# Patient Record
Sex: Female | Born: 1995 | Race: Black or African American | Hispanic: No | Marital: Single | Smoking: Never smoker
Health system: Southern US, Community
[De-identification: ages and names within clinical notes are randomized; demographics above are authoritative.]

## PROBLEM LIST (undated history)

## (undated) DIAGNOSIS — K5190 Ulcerative colitis, unspecified, without complications: Secondary | ICD-10-CM

## (undated) HISTORY — PX: TONSILLECTOMY: SUR1361

## (undated) HISTORY — PX: WISDOM TOOTH EXTRACTION: SHX21

---
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
## 2015-01-17 ENCOUNTER — Emergency Department (INDEPENDENT_AMBULATORY_CARE_PROVIDER_SITE_OTHER)
Admission: EM | Admit: 2015-01-17 | Discharge: 2015-01-17 | Disposition: A | Discharge status: Home / Self Care | Payer: Medicaid Other | Source: Home / Self Care

## 2015-01-17 ENCOUNTER — Encounter (HOSPITAL_COMMUNITY): Payer: Self-pay | Admitting: Emergency Medicine

## 2015-01-17 DIAGNOSIS — N644 Mastodynia: Secondary | ICD-10-CM

## 2015-01-17 DIAGNOSIS — N611 Abscess of the breast and nipple: Secondary | ICD-10-CM

## 2015-01-17 DIAGNOSIS — N61 Inflammatory disorders of breast: Secondary | ICD-10-CM

## 2015-01-17 MED ORDER — DOXYCYCLINE MONOHYDRATE 100 MG PO CAPS: 100.0000 mg | ORAL_CAPSULE | Freq: Two times a day (BID) | ORAL | Status: DC

## 2015-01-17 NOTE — ED Provider Notes (Addendum)
CSN: [id number]     Arrival date & time 01/17/15  1631 History   None    No chief complaint on file.  (Consider location/radiation/quality/duration/timing/severity/associated sxs/prior Treatment) The history is provided by the patient (And mother). No language interpreter was used.  C/O pain on her right breast and swelling which started a little over 1 wk ago but the pain worsened in the last 2 days., denies any injury, no change in her bra/ under wear. No fever, no nipple discharge, no discharge from the swelling. Some scalyness and dryness on the skin over her breast. Pain is about 6/10 in severity. No known trigger of her symptoms.  Past Medical History  Diagnosis Date  . UC (ulcerative colitis)     Pt reoprts she is in remission   Past Surgical History  Procedure Laterality Date  . Tonsillectomy    . Wisdom tooth extraction     Family History  Problem Relation Age of Onset  . Diabetes Mother   . Diabetes Maternal Grandmother    Social History  Substance Use Topics  . Smoking status: Never Smoker   . Smokeless tobacco: Not on file  . Alcohol Use: No   OB History    Gravida Para Term Preterm AB TAB SAB Ectopic Multiple Living   0              Review of Systems  Respiratory: Negative.   Cardiovascular: Negative.   Gastrointestinal: Negative.   Skin: Positive for wound.       Swelling and pain of right breast  All other systems reviewed and are negative.   Allergies  Review of patient's allergies indicates not on file.  Home Medications   Prior to Admission medications   Not on File   Meds Ordered and Administered this Visit  Medications - No data to display  BP 115/81 mmHg  Pulse 81  Temp(Src) 98.2 F (36.8 C) (Oral)  Resp 16  SpO2 100% No data found.   Physical Exam  Constitutional: She appears well-developed. No distress.  Cardiovascular: Normal rate, regular rhythm, normal heart sounds and intact distal pulses.   No murmur  heard. Pulmonary/Chest: Effort normal and breath sounds normal. No respiratory distress. She has no wheezes. She exhibits no mass and no retraction. Right breast exhibits skin change and tenderness. Right breast exhibits no inverted nipple and no nipple discharge. Left breast exhibits no inverted nipple, no mass, no nipple discharge, no skin change and no tenderness. Breasts are symmetrical.    Musculoskeletal: Normal range of motion.  Nursing note and vitals reviewed.   ED Course  Procedures (including critical care time)  Labs Review Labs Reviewed - No data to display  Imaging Review No results found.   Visual Acuity Review  Right Eye Distance:   Left Eye Distance:   Bilateral Distance:    Right Eye Near:   Left Eye Near:    Bilateral Near:         MDM  No diagnosis found. Breast pain. Carbuncle  Yellowish pus sent for culture. Doxycycline prescribed today. Ibuprofen as needed for pain,. I will call patient with culture report. Patient advised to obtain breast U/S if no improvement despite A/B treatment. She and her mother agreed with plan.        Doreene Eland, MD 01/17/15 [phone]

## 2015-01-17 NOTE — ED Notes (Signed)
At bedside for culture of right breast wound.

## 2015-01-17 NOTE — ED Notes (Signed)
The patient presented to the [facility] with a complaint on a cyst on her right breast. The patient stated that has been there over a week but has grown in size over the last week.

## 2015-01-17 NOTE — Discharge Instructions (Signed)
Abscess °An abscess (boil or furuncle) is an infected area on or under the skin. This area is filled with yellowish-white fluid (pus) and other material (debris). °HOME CARE  °· Only take medicines as told by your doctor. °· If you were given antibiotic medicine, take it as directed. Finish the medicine even if you start to feel better. °· If gauze is used, follow your doctor's directions for changing the gauze. °· To avoid spreading the infection: °¨ Keep your abscess covered with a bandage. °¨ Wash your hands well. °¨ Do not share personal care items, towels, or whirlpools with others. °¨ Avoid skin contact with others. °· Keep your skin and clothes clean around the abscess. °· Keep all doctor visits as told. °GET HELP RIGHT AWAY IF:  °· You have more pain, puffiness (swelling), or redness in the wound site. °· You have more fluid or blood coming from the wound site. °· You have muscle aches, chills, or you feel sick. °· You have a fever. °MAKE SURE YOU:  °· Understand these instructions. °· Will watch your condition. °· Will get help right away if you are not doing well or get worse. °Document Released: 09/27/2007 Document Revised: 10/10/2011 Document Reviewed: 06/23/2011 °ExitCare® Patient Information ©2015 [employer]. This information is not intended to replace advice given to you by your health care provider. Make sure you discuss any questions you have with your health care provider. ° °

## 2015-01-20 LAB — WOUND CULTURE

## 2015-01-21 NOTE — ED Notes (Signed)
Final report of abscess c &S negative

## 2015-06-01 ENCOUNTER — Emergency Department (INDEPENDENT_AMBULATORY_CARE_PROVIDER_SITE_OTHER)
Admission: EM | Admit: 2015-06-01 | Discharge: 2015-06-01 | Disposition: A | Discharge status: Home / Self Care | Payer: Medicaid Other | Source: Home / Self Care | Attending: Family Medicine | Admitting: Family Medicine

## 2015-06-01 ENCOUNTER — Encounter (HOSPITAL_COMMUNITY): Payer: Self-pay | Admitting: Emergency Medicine

## 2015-06-01 DIAGNOSIS — J069 Acute upper respiratory infection, unspecified: Secondary | ICD-10-CM

## 2015-06-01 NOTE — ED Notes (Signed)
Cough, chest soreness, body aches and increase in chest soreness with coughing.  Patient reports nausea, no vomiting.  Patient reports yellow phlegm.  Patient say she ran a fever of 101 last night.  Patient has no fever here today.  Took dayquil early this morning, but nothing recently

## 2015-06-01 NOTE — ED Provider Notes (Signed)
CSN: [id number]     Arrival date & time 06/01/15  1558 History   First MD Initiated Contact with Patient 06/01/15 1756     Chief Complaint  Patient presents with  . URI   (Consider location/radiation/quality/duration/timing/severity/associated sxs/prior Treatment) HPI Comments: 20 year old female complaining of a two-day history of nausea, body aches, cough and chest pain associated with cough. She states last night she had a temperature of 100.9. Denies GI or GU symptoms.  Patient is a 20 y.o. female presenting with URI.  URI Presenting symptoms: congestion, cough and fever   Presenting symptoms: no fatigue, no rhinorrhea and no sore throat   Associated symptoms: no neck pain     Past Medical History  Diagnosis Date  . UC (ulcerative colitis) (HCC)     Pt reoprts she is in remission   Past Surgical History  Procedure Laterality Date  . Tonsillectomy    . Wisdom tooth extraction     Family History  Problem Relation Age of Onset  . Diabetes Mother   . Diabetes Maternal Grandmother    Social History  Substance Use Topics  . Smoking status: Never Smoker   . Smokeless tobacco: None  . Alcohol Use: No   OB History    Gravida Para Term Preterm AB TAB SAB Ectopic Multiple Living   0              Review of Systems  Constitutional: Positive for fever and activity change. Negative for chills, appetite change and fatigue.  HENT: Positive for congestion and postnasal drip. Negative for facial swelling, rhinorrhea and sore throat.   Eyes: Negative.   Respiratory: Positive for cough. Negative for shortness of breath.   Cardiovascular: Negative.   Musculoskeletal: Negative for neck pain and neck stiffness.  Skin: Negative for pallor and rash.  Neurological: Negative.     Allergies  Review of patient's allergies indicates no known allergies.  Home Medications   Prior to Admission medications   Medication Sig Start Date End Date Taking? Authorizing Provider   Pseudoeph-Doxylamine-DM-APAP (NYQUIL PO) Take by mouth.   Yes Historical Provider, MD  Pseudoephedrine-APAP-DM (DAYQUIL PO) Take by mouth.   Yes Historical Provider, MD  doxycycline (MONODOX) 100 MG capsule Take 1 capsule (100 mg total) by mouth 2 (two) times daily. Patient not taking: Reported on 06/01/2015 01/17/15   Doreene Eland, MD  mercaptopurine (PURINETHOL) 50 MG tablet Take 1.5 mg/kg by mouth daily. Give on an empty stomach 1 hour before or 2 hours after meals. Caution: Chemotherapy.    Historical Provider, MD  pantoprazole (PROTONIX) 20 MG tablet Take 20 mg by mouth daily.    Historical Provider, MD   Meds Ordered and Administered this Visit  Medications - No data to display  BP 122/87 mmHg  Pulse 86  Temp(Src) 98.2 F (36.8 C) (Oral)  Resp 12  SpO2 100% No data found.   Physical Exam  Constitutional: She is oriented to person, place, and time. She appears well-developed and well-nourished. No distress.  Sitting on the table. Appears generally well. Smiling and showing no signs of distress. Energetic speech. Relaxed posturing.  HENT:  Mouth/Throat: No oropharyngeal exudate.  Bilateral TMs are normal. Oropharynx with clear PND. No erythema or exudates.  Eyes: Conjunctivae and EOM are normal.  Neck: Normal range of motion. Neck supple.  Cardiovascular: Normal rate, regular rhythm and normal heart sounds.   Pulmonary/Chest: Effort normal and breath sounds normal. No respiratory distress. She has no wheezes.  Musculoskeletal: Normal range  of motion. She exhibits no edema.  Lymphadenopathy:    She has no cervical adenopathy.  Neurological: She is alert and oriented to person, place, and time.  Skin: Skin is warm and dry. No rash noted.  Psychiatric: She has a normal mood and affect.  Nursing note and vitals reviewed.   ED Course  Procedures (including critical care time)  Labs Review Labs Reviewed - No data to display  Imaging Review No results found.   Visual  Acuity Review  Right Eye Distance:   Left Eye Distance:   Bilateral Distance:    Right Eye Near:   Left Eye Near:    Bilateral Near:         MDM   1. URI (upper respiratory infection)    For nasal and head congestion may take Sudafed PE 10 mg every 4 hours as needed. Saline nasal spray used frequently. For drainage may use Allegra, Claritin or Zyrtec. If you need stronger medicine to stop drainage may take Chlor-Trimeton 2-4 mg every 4 hours. This may cause drowsiness. Ibuprofen 600 mg every 6 hours as needed for pain, discomfort or fever. Drink plenty of fluids and stay well-hydrated.     Hayden Rasmussen, NP 06/01/15 [phone]

## 2015-06-01 NOTE — Discharge Instructions (Signed)
Upper Respiratory Infection, Adult °For nasal and head congestion may take Sudafed PE 10 mg every 4 hours as needed. °Saline nasal spray used frequently. °For drainage may use Allegra, Claritin or Zyrtec. If you need stronger medicine to stop drainage may take Chlor-Trimeton 2-4 mg every 4 hours. This may cause drowsiness. °Ibuprofen 600 mg every 6 hours as needed for pain, discomfort or fever. °Drink plenty of fluids and stay well-hydrated. ° °Most upper respiratory infections (URIs) are a viral infection of the air passages leading to the lungs. A URI affects the nose, throat, and upper air passages. The most common type of URI is nasopharyngitis and is typically referred to as "the common cold." °URIs run their course and usually go away on their own. Most of the time, a URI does not require medical attention, but sometimes a bacterial infection in the upper airways can follow a viral infection. This is called a secondary infection. Sinus and middle ear infections are common types of secondary upper respiratory infections. °Bacterial pneumonia can also complicate a URI. A URI can worsen asthma and chronic obstructive pulmonary disease (COPD). Sometimes, these complications can require emergency medical care and may be life threatening.  °CAUSES °Almost all URIs are caused by viruses. A virus is a type of germ and can spread from one person to another.  °RISKS FACTORS °You may be at risk for a URI if:  °· You smoke.   °· You have chronic heart or lung disease. °· You have a weakened defense (immune) system.   °· You are very young or very old.   °· You have nasal allergies or asthma. °· You work in crowded or poorly ventilated areas. °· You work in health care facilities or schools. °SIGNS AND SYMPTOMS  °Symptoms typically develop 2-3 days after you come in contact with a cold virus. Most viral URIs last 7-10 days. However, viral URIs from the influenza virus (flu virus) can last 14-18 days and are typically more  severe. Symptoms may include:  °· Runny or stuffy (congested) nose.   °· Sneezing.   °· Cough.   °· Sore throat.   °· Headache.   °· Fatigue.   °· Fever.   °· Loss of appetite.   °· Pain in your forehead, behind your eyes, and over your cheekbones (sinus pain). °· Muscle aches.   °DIAGNOSIS  °Your health care provider may diagnose a URI by: °· Physical exam. °· Tests to check that your symptoms are not due to another condition such as: °¨ Strep throat. °¨ Sinusitis. °¨ Pneumonia. °¨ Asthma. °TREATMENT  °A URI goes away on its own with time. It cannot be cured with medicines, but medicines may be prescribed or recommended to relieve symptoms. Medicines may help: °· Reduce your fever. °· Reduce your cough. °· Relieve nasal congestion. °HOME CARE INSTRUCTIONS  °· Take medicines only as directed by your health care provider.   °· Gargle warm saltwater or take cough drops to comfort your throat as directed by your health care provider. °· Use a warm mist humidifier or inhale steam from a shower to increase air moisture. This may make it easier to breathe. °· Drink enough fluid to keep your urine clear or pale yellow.   °· Eat soups and other clear broths and maintain good nutrition.   °· Rest as needed.   °· Return to work when your temperature has returned to normal or as your health care provider advises. You may need to stay home longer to avoid infecting others. You can also use a face mask and careful hand washing   to prevent spread of the virus. °· Increase the usage of your inhaler if you have asthma.   °· Do not use any tobacco products, including cigarettes, chewing tobacco, or electronic cigarettes. If you need help quitting, ask your health care provider. °PREVENTION  °The best way to protect yourself from getting a cold is to practice good hygiene.  °· Avoid oral or hand contact with people with cold symptoms.   °· Wash your hands often if contact occurs.   °There is no clear evidence that vitamin C, vitamin  E, echinacea, or exercise reduces the chance of developing a cold. However, it is always recommended to get plenty of rest, exercise, and practice good nutrition.  °SEEK MEDICAL CARE IF:  °· You are getting worse rather than better.   °· Your symptoms are not controlled by medicine.   °· You have chills. °· You have worsening shortness of breath. °· You have brown or red mucus. °· You have yellow or brown nasal discharge. °· You have pain in your face, especially when you bend forward. °· You have a fever. °· You have swollen neck glands. °· You have pain while swallowing. °· You have white areas in the back of your throat. °SEEK IMMEDIATE MEDICAL CARE IF:  °· You have severe or persistent: °¨ Headache. °¨ Ear pain. °¨ Sinus pain. °¨ Chest pain. °· You have chronic lung disease and any of the following: °¨ Wheezing. °¨ Prolonged cough. °¨ Coughing up blood. °¨ A change in your usual mucus. °· You have a stiff neck. °· You have changes in your: °¨ Vision. °¨ Hearing. °¨ Thinking. °¨ Mood. °MAKE SURE YOU:  °· Understand these instructions. °· Will watch your condition. °· Will get help right away if you are not doing well or get worse. °  °This information is not intended to replace advice given to you by your health care provider. Make sure you discuss any questions you have with your health care provider. °  °Document Released: 10/04/2000 Document Revised: 08/25/2014 Document Reviewed: 07/16/2013 °Elsevier Interactive Patient Education ©2016 Elsevier Inc. ° °

## 2015-07-28 ENCOUNTER — Emergency Department (HOSPITAL_COMMUNITY)
Admission: EM | Admit: 2015-07-28 | Discharge: 2015-07-28 | Disposition: A | Discharge status: Home / Self Care | Payer: Medicaid Other | Attending: Emergency Medicine | Admitting: Emergency Medicine

## 2015-07-28 ENCOUNTER — Encounter (HOSPITAL_COMMUNITY): Payer: Self-pay

## 2015-07-28 DIAGNOSIS — Z79899 Other long term (current) drug therapy: Secondary | ICD-10-CM | POA: Insufficient documentation

## 2015-07-28 DIAGNOSIS — Z3202 Encounter for pregnancy test, result negative: Secondary | ICD-10-CM | POA: Diagnosis not present

## 2015-07-28 DIAGNOSIS — R1032 Left lower quadrant pain: Secondary | ICD-10-CM | POA: Diagnosis present

## 2015-07-28 DIAGNOSIS — R112 Nausea with vomiting, unspecified: Secondary | ICD-10-CM | POA: Diagnosis not present

## 2015-07-28 DIAGNOSIS — K5120 Ulcerative (chronic) proctitis without complications: Secondary | ICD-10-CM | POA: Diagnosis not present

## 2015-07-28 LAB — URINALYSIS, ROUTINE W REFLEX MICROSCOPIC
Bilirubin Urine: NEGATIVE
GLUCOSE, UA: NEGATIVE mg/dL
KETONES UR: NEGATIVE mg/dL
LEUKOCYTES UA: NEGATIVE
NITRITE: NEGATIVE
PROTEIN: NEGATIVE mg/dL
Specific Gravity, Urine: 1.02 (ref 1.005–1.030)
pH: 6.5 (ref 5.0–8.0)

## 2015-07-28 LAB — CBC
HEMATOCRIT: 34.4 % — AB (ref 36.0–46.0)
Hemoglobin: 10.9 g/dL — ABNORMAL LOW (ref 12.0–15.0)
MCH: 27.9 pg (ref 26.0–34.0)
MCHC: 31.7 g/dL (ref 30.0–36.0)
MCV: 88 fL (ref 78.0–100.0)
Platelets: 321 10*3/uL (ref 150–400)
RBC: 3.91 MIL/uL (ref 3.87–5.11)
RDW: 12.4 % (ref 11.5–15.5)
WBC: 7.3 10*3/uL (ref 4.0–10.5)

## 2015-07-28 LAB — COMPREHENSIVE METABOLIC PANEL
ALT: 21 U/L (ref 14–54)
AST: 20 U/L (ref 15–41)
Albumin: 4.8 g/dL (ref 3.5–5.0)
Alkaline Phosphatase: 43 U/L (ref 38–126)
Anion gap: 9 (ref 5–15)
BUN: 9 mg/dL (ref 6–20)
CHLORIDE: 108 mmol/L (ref 101–111)
CO2: 24 mmol/L (ref 22–32)
Calcium: 9.3 mg/dL (ref 8.9–10.3)
Creatinine, Ser: 0.53 mg/dL (ref 0.44–1.00)
Glucose, Bld: 100 mg/dL — ABNORMAL HIGH (ref 65–99)
POTASSIUM: 3.4 mmol/L — AB (ref 3.5–5.1)
Sodium: 141 mmol/L (ref 135–145)
Total Bilirubin: 0.7 mg/dL (ref 0.3–1.2)
Total Protein: 8.1 g/dL (ref 6.5–8.1)

## 2015-07-28 LAB — URINE MICROSCOPIC-ADD ON

## 2015-07-28 LAB — ABO/RH: ABO/RH(D): O POS

## 2015-07-28 LAB — TYPE AND SCREEN
ABO/RH(D): O POS
Antibody Screen: NEGATIVE

## 2015-07-28 LAB — I-STAT BETA HCG BLOOD, ED (MC, WL, AP ONLY): I-stat hCG, quantitative: <5 m[IU]/mL (ref <5)

## 2015-07-28 LAB — LIPASE, BLOOD: LIPASE: 33 U/L (ref 11–51)

## 2015-07-28 MED ORDER — MERCAPTOPURINE 50 MG PO TABS: 100.0000 mg | ORAL_TABLET | Freq: Every day | ORAL | Status: DC

## 2015-07-28 MED ORDER — MESALAMINE 1000 MG RE SUPP: 1000.0000 mg | Freq: Every day | RECTAL | Status: DC

## 2015-07-28 NOTE — Progress Notes (Signed)
Medicaid [place] access indicates your assigned pcp is [facility] [address] [place], [place] [id number] [phone]

## 2015-07-28 NOTE — ED Notes (Signed)
She cites hx of ulcerative colitis.  She states she has had a lot of recent stress with working and school.

## 2015-07-28 NOTE — ED Notes (Addendum)
Pt c/o generalized abdominal pain and n/v/d x 3 days.  Pain score 8/10.  Pt reports bright red blood in stool this morning.  Pt has not taken anything for symptoms.  Hx of colitis.

## 2015-07-28 NOTE — ED Provider Notes (Signed)
CSN: [id number]     Arrival date & time 07/28/15  1242 History   First MD Initiated Contact with Patient 07/28/15 1543     Chief Complaint  Patient presents with  . Abdominal Pain  . Diarrhea  . Emesis     (Consider location/radiation/quality/duration/timing/severity/associated sxs/prior Treatment) Patient is a 20 y.o. female presenting with abdominal pain.  Abdominal Pain Pain location:  LLQ Pain quality: aching, fullness and gnawing   Pain severity:  Mild Onset quality:  Gradual Duration:  3 days Timing:  Constant Progression:  Unchanged Chronicity:  Recurrent Context: diet changes   Relieved by:  None tried Worsened by:  Nothing tried Ineffective treatments:  None tried Associated symptoms: diarrhea, nausea and vomiting   Associated symptoms: no anorexia, no chills, no cough, no dysuria, no fever and no hematuria     Past Medical History  Diagnosis Date  . UC (ulcerative colitis) (HCC)     Pt reoprts she is in remission   Past Surgical History  Procedure Laterality Date  . Tonsillectomy    . Wisdom tooth extraction     Family History  Problem Relation Age of Onset  . Diabetes Mother   . Diabetes Maternal Grandmother    Social History  Substance Use Topics  . Smoking status: Never Smoker   . Smokeless tobacco: None  . Alcohol Use: No   OB History    Gravida Para Term Preterm AB TAB SAB Ectopic Multiple Living   0              Review of Systems  Constitutional: Negative for fever and chills.  Eyes: Negative for pain and itching.  Respiratory: Negative for cough.   Gastrointestinal: Positive for nausea, vomiting, abdominal pain and diarrhea. Negative for anorexia.  Endocrine: Negative for polydipsia and polyuria.  Genitourinary: Negative for dysuria and hematuria.  All other systems reviewed and are negative.     Allergies  Review of patient's allergies indicates no known allergies.  Home Medications   Prior to Admission medications    Medication Sig Start Date End Date Taking? Authorizing Provider  albuterol (PROVENTIL HFA;VENTOLIN HFA) 108 (90 Base) MCG/ACT inhaler Inhale 2 puffs into the lungs every 6 (six) hours as needed for wheezing or shortness of breath.   Yes Historical Provider, MD  Omega-3 Fatty Acids (FISH OIL) 1000 MG CAPS Take 1 capsule by mouth daily.   Yes Historical Provider, MD  pantoprazole (PROTONIX) 20 MG tablet Take 20 mg by mouth daily.   Yes Historical Provider, MD  mercaptopurine (PURINETHOL) 50 MG tablet Take 2 tablets (100 mg total) by mouth daily. Give on an empty stomach 1 hour before or 2 hours after meals. Caution: Chemotherapy. 07/28/15   Marily MemosJason Iren Whipp, MD  mesalamine (CANASA) 1000 MG suppository Place 1 suppository (1,000 mg total) rectally at bedtime. 07/28/15   Marily MemosJason Kenlea Woodell, MD   BP 123/81 mmHg  Pulse 77  Temp(Src) 97.9 F (36.6 C) (Oral)  Resp 14  SpO2 100%  LMP 07/24/2015 Physical Exam  Constitutional: She appears well-developed and well-nourished.  HENT:  Head: Normocephalic and atraumatic.  Neck: Normal range of motion.  Cardiovascular: Normal rate and regular rhythm.   Pulmonary/Chest: No stridor. No respiratory distress.  Abdominal: Soft. Bowel sounds are normal. She exhibits no distension. There is no tenderness. There is no rebound.  Neurological: She is alert.  Skin: Skin is warm and dry. No rash noted. No erythema.  Nursing note and vitals reviewed.   ED Course  Procedures (including  critical care time) Labs Review Labs Reviewed  COMPREHENSIVE METABOLIC PANEL - Abnormal; Notable for the following:    Potassium 3.4 (*)    Glucose, Bld 100 (*)    All other components within normal limits  CBC - Abnormal; Notable for the following:    Hemoglobin 10.9 (*)    HCT 34.4 (*)    All other components within normal limits  URINALYSIS, ROUTINE W REFLEX MICROSCOPIC (NOT AT [facility]) - Abnormal; Notable for the following:    Hgb urine dipstick SMALL (*)    All other components within  normal limits  URINE MICROSCOPIC-ADD ON - Abnormal; Notable for the following:    Squamous Epithelial / LPF 0-5 (*)    Bacteria, UA RARE (*)    All other components within normal limits  LIPASE, BLOOD  I-STAT BETA HCG BLOOD, ED ([facility], [facility], [facility] ONLY)  TYPE AND SCREEN  ABO/RH    Imaging Review No results found. I have personally reviewed and evaluated these images and lab results as part of my medical decision-making.   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 Not on meds. Doesn't have a GI doctor here yet (in school here). WBC ok. No hemodynamic instability. Abdomen benign, doubt any serious complications. Patient appears well hydrated, no indication for admission. Will discuss with GI about trying to get follow up.  Discussed with GI. Patient should restart mercaptopurine and mesalamine and can see them in the next 2 weeks.   New Prescriptions: Discharge Medication List as of 07/28/2015  5:08 PM    START taking these medications   Details  mesalamine (CANASA) 1000 MG suppository Place 1 suppository (1,000 mg total) rectally at bedtime., Starting 07/28/2015, Until Discontinued, Print         I have personally and contemperaneously reviewed labs and imaging and used in my decision making as above.   A medical screening exam was performed and I feel the patient has had an appropriate workup for their chief complaint at this time and likelihood of emergent condition existing is low. Their vital signs are stable. They have been counseled on decision, discharge, follow up and which symptoms necessitate immediate return to the emergency department.  They verbally stated understanding and agreement with plan and discharged in stable condition.      Marily Memos, MD 07/28/15 [phone]

## 2015-11-11 ENCOUNTER — Emergency Department (HOSPITAL_COMMUNITY): Payer: Medicaid Other

## 2015-11-11 ENCOUNTER — Emergency Department (HOSPITAL_COMMUNITY)
Admission: EM | Admit: 2015-11-11 | Discharge: 2015-11-11 | Disposition: A | Discharge status: Home / Self Care | Payer: Medicaid Other | Attending: Emergency Medicine | Admitting: Emergency Medicine

## 2015-11-11 ENCOUNTER — Encounter (HOSPITAL_COMMUNITY): Payer: Self-pay

## 2015-11-11 DIAGNOSIS — J45901 Unspecified asthma with (acute) exacerbation: Secondary | ICD-10-CM

## 2015-11-11 DIAGNOSIS — Z79899 Other long term (current) drug therapy: Secondary | ICD-10-CM | POA: Diagnosis not present

## 2015-11-11 DIAGNOSIS — Z7951 Long term (current) use of inhaled steroids: Secondary | ICD-10-CM | POA: Insufficient documentation

## 2015-11-11 DIAGNOSIS — R0602 Shortness of breath: Secondary | ICD-10-CM | POA: Diagnosis present

## 2015-11-11 MED ORDER — ALBUTEROL SULFATE (2.5 MG/3ML) 0.083% IN NEBU
5.0000 mg | INHALATION_SOLUTION | Freq: Once | RESPIRATORY_TRACT | Status: AC
Start: 2015-11-11 — End: 2015-11-11
  Administered 2015-11-11: 5 mg via RESPIRATORY_TRACT
  Filled 2015-11-11: qty 6

## 2015-11-11 MED ORDER — ALBUTEROL SULFATE HFA 108 (90 BASE) MCG/ACT IN AERS
2.0000 | INHALATION_SPRAY | RESPIRATORY_TRACT | Status: DC | PRN
  Administered 2015-11-11: 2 via RESPIRATORY_TRACT
  Filled 2015-11-11: qty 6.7

## 2015-11-11 MED ORDER — PREDNISONE 20 MG PO TABS: 40.0000 mg | ORAL_TABLET | Freq: Every day | ORAL | Status: DC

## 2015-11-11 MED ORDER — PREDNISONE 20 MG PO TABS
60.0000 mg | ORAL_TABLET | Freq: Once | ORAL | Status: AC
  Administered 2015-11-11: 60 mg via ORAL
  Filled 2015-11-11: qty 3

## 2015-11-11 MED ORDER — ACETAMINOPHEN 325 MG PO TABS
650.0000 mg | ORAL_TABLET | Freq: Once | ORAL | Status: AC
  Administered 2015-11-11: 650 mg via ORAL
  Filled 2015-11-11: qty 2

## 2015-11-11 NOTE — ED Notes (Addendum)
Pt presents with c/o shortness of breath for approx 2 weeks. Pt reports hx of asthma. Pt reports some pain in her chest as well when she is taking a deep breath.

## 2015-11-11 NOTE — ED Provider Notes (Signed)
CSN: [id number]     Arrival date & time 11/11/15  1756 History   First MD Initiated Contact with Patient 11/11/15 1958     Chief Complaint  Patient presents with  . Shortness of Breath     (Consider location/radiation/quality/duration/timing/severity/associated sxs/prior Treatment) HPI Comments: Patient presents to the emergency department with chief complaint of shortness of breath and chest tightness 2 weeks. She reports a history of asthma. She states that her inhaler and allergy medications has not been working. She reports increased chest tightness when she takes deep breath. She denies any fever, chills, cough. Denies any additional symptoms. There are no modifying factors. She denies any recent travel. Denies any lower extremity swelling. Denies any history of PE or DVT.  The history is provided by the patient. No language interpreter was used.    Past Medical History  Diagnosis Date  . UC (ulcerative colitis) (HCC)     Pt reoprts she is in remission   Past Surgical History  Procedure Laterality Date  . Tonsillectomy    . Wisdom tooth extraction     Family History  Problem Relation Age of Onset  . Diabetes Mother   . Diabetes Maternal Grandmother    Social History  Substance Use Topics  . Smoking status: Never Smoker   . Smokeless tobacco: None  . Alcohol Use: No   OB History    Gravida Para Term Preterm AB TAB SAB Ectopic Multiple Living   0              Review of Systems  Respiratory: Positive for chest tightness and shortness of breath.   All other systems reviewed and are negative.     Allergies  Review of patient's allergies indicates no known allergies.  Home Medications   Prior to Admission medications   Medication Sig Start Date End Date Taking? Authorizing Provider  albuterol (PROVENTIL HFA;VENTOLIN HFA) 108 (90 Base) MCG/ACT inhaler Inhale 2 puffs into the lungs every 6 (six) hours as needed for wheezing or shortness of breath.   Yes Historical  Provider, MD  cetirizine (ZYRTEC) 10 MG tablet Take 10 mg by mouth daily as needed for allergies.   Yes Historical Provider, MD  mercaptopurine (PURINETHOL) 50 MG tablet Take 2 tablets (100 mg total) by mouth daily. Give on an empty stomach 1 hour before or 2 hours after meals. Caution: Chemotherapy. 07/28/15  Yes Marily MemosJason Mesner, MD  mesalamine (CANASA) 1000 MG suppository Place 1 suppository (1,000 mg total) rectally at bedtime. Patient taking differently: Place 1,000 mg rectally at bedtime as needed (constipation.).  07/28/15  Yes Marily MemosJason Mesner, MD  Omega-3 Fatty Acids (FISH OIL) 1000 MG CAPS Take 1 capsule by mouth daily.   Yes Historical Provider, MD  pantoprazole (PROTONIX) 20 MG tablet Take 20 mg by mouth daily.   Yes Historical Provider, MD   BP 136/96 mmHg  Pulse 101  Temp(Src) 98.7 F (37.1 C) (Oral)  Resp 18  SpO2 98%  LMP 11/09/2015 (Approximate) Physical Exam  Constitutional: She is oriented to person, place, and time. She appears well-developed and well-nourished.  HENT:  Head: Normocephalic and atraumatic.  Eyes: Conjunctivae and EOM are normal. Pupils are equal, round, and reactive to light.  Neck: Normal range of motion. Neck supple.  Cardiovascular: Normal rate and regular rhythm.  Exam reveals no gallop and no friction rub.   No murmur heard. Normal rate on my exam  Pulmonary/Chest: Effort normal and breath sounds normal. No respiratory distress. She has no wheezes.  She has no rales. She exhibits no tenderness.  Clear to auscultation on my exam  Abdominal: Soft. Bowel sounds are normal. She exhibits no distension and no mass. There is no tenderness. There is no rebound and no guarding.  Musculoskeletal: Normal range of motion. She exhibits no edema or tenderness.  Neurological: She is alert and oriented to person, place, and time.  Skin: Skin is warm and dry.  Psychiatric: She has a normal mood and affect. Her behavior is normal. Judgment and thought content normal.  Nursing  note and vitals reviewed.   ED Course  Procedures (including critical care time) Labs Review Labs Reviewed - No data to display  Imaging Review Dg Chest 2 View  11/11/2015  CLINICAL DATA:  Shortness of breath EXAM: CHEST  2 VIEW COMPARISON:  None. FINDINGS: The heart size and mediastinal contours are within normal limits. Both lungs are clear. The visualized skeletal structures are unremarkable. IMPRESSION: No active cardiopulmonary disease. Electronically Signed   By: Gerome Sam III M.D   On: 11/11/2015 19:25   I have personally reviewed and evaluated these images and lab results as part of my medical decision-making.   EKG Interpretation   Date/Time:  Thursday November 11 2015 18:08:28 EDT Ventricular Rate:  111 PR Interval:    QRS Duration: 112 QT Interval:  334 QTC Calculation: 454 R Axis:   77 Text Interpretation:  Sinus tachycardia Incomplete right bundle branch  block No old tracing to compare Confirmed by KNAPP  MD-J, JON ([id number]) on  11/11/2015 6:20:27 PM      MDM   Final diagnoses:  Asthma exacerbation    Patient with symptoms consistent with asthma exacerbation.  No current signs of respiratory distress. Lung exam improved after nebulizer treatment. Patient states that she feels much better now.  Prednisone given in the ED and pt will bd dc with 5 day burst. Pt states they are breathing at baseline.  Denies any hx of PE/DVT.  Well's PE score is 1.5, low risk group for PE. Pt has been instructed to continue using prescribed medications and to speak with PCP about today's exacerbation.      Roxy Horseman, PA-C 11/11/15 2022  Roxy Horseman, PA-C 11/11/15 2030  Linwood Dibbles, MD 11/11/15 2040

## 2015-11-11 NOTE — Discharge Instructions (Signed)

## 2015-11-11 NOTE — ED Notes (Signed)
Bed: WA04 Expected date:  Expected time:  Means of arrival:  Comments: Pt is getting dressed

## 2017-02-21 IMAGING — DX DG CHEST 2V
2 series · 2 of 2 positions shown · non-contrast
Comparison: None.

CLINICAL DATA: Shortness of breath

EXAM:
CHEST  2 VIEW

[chest pa]
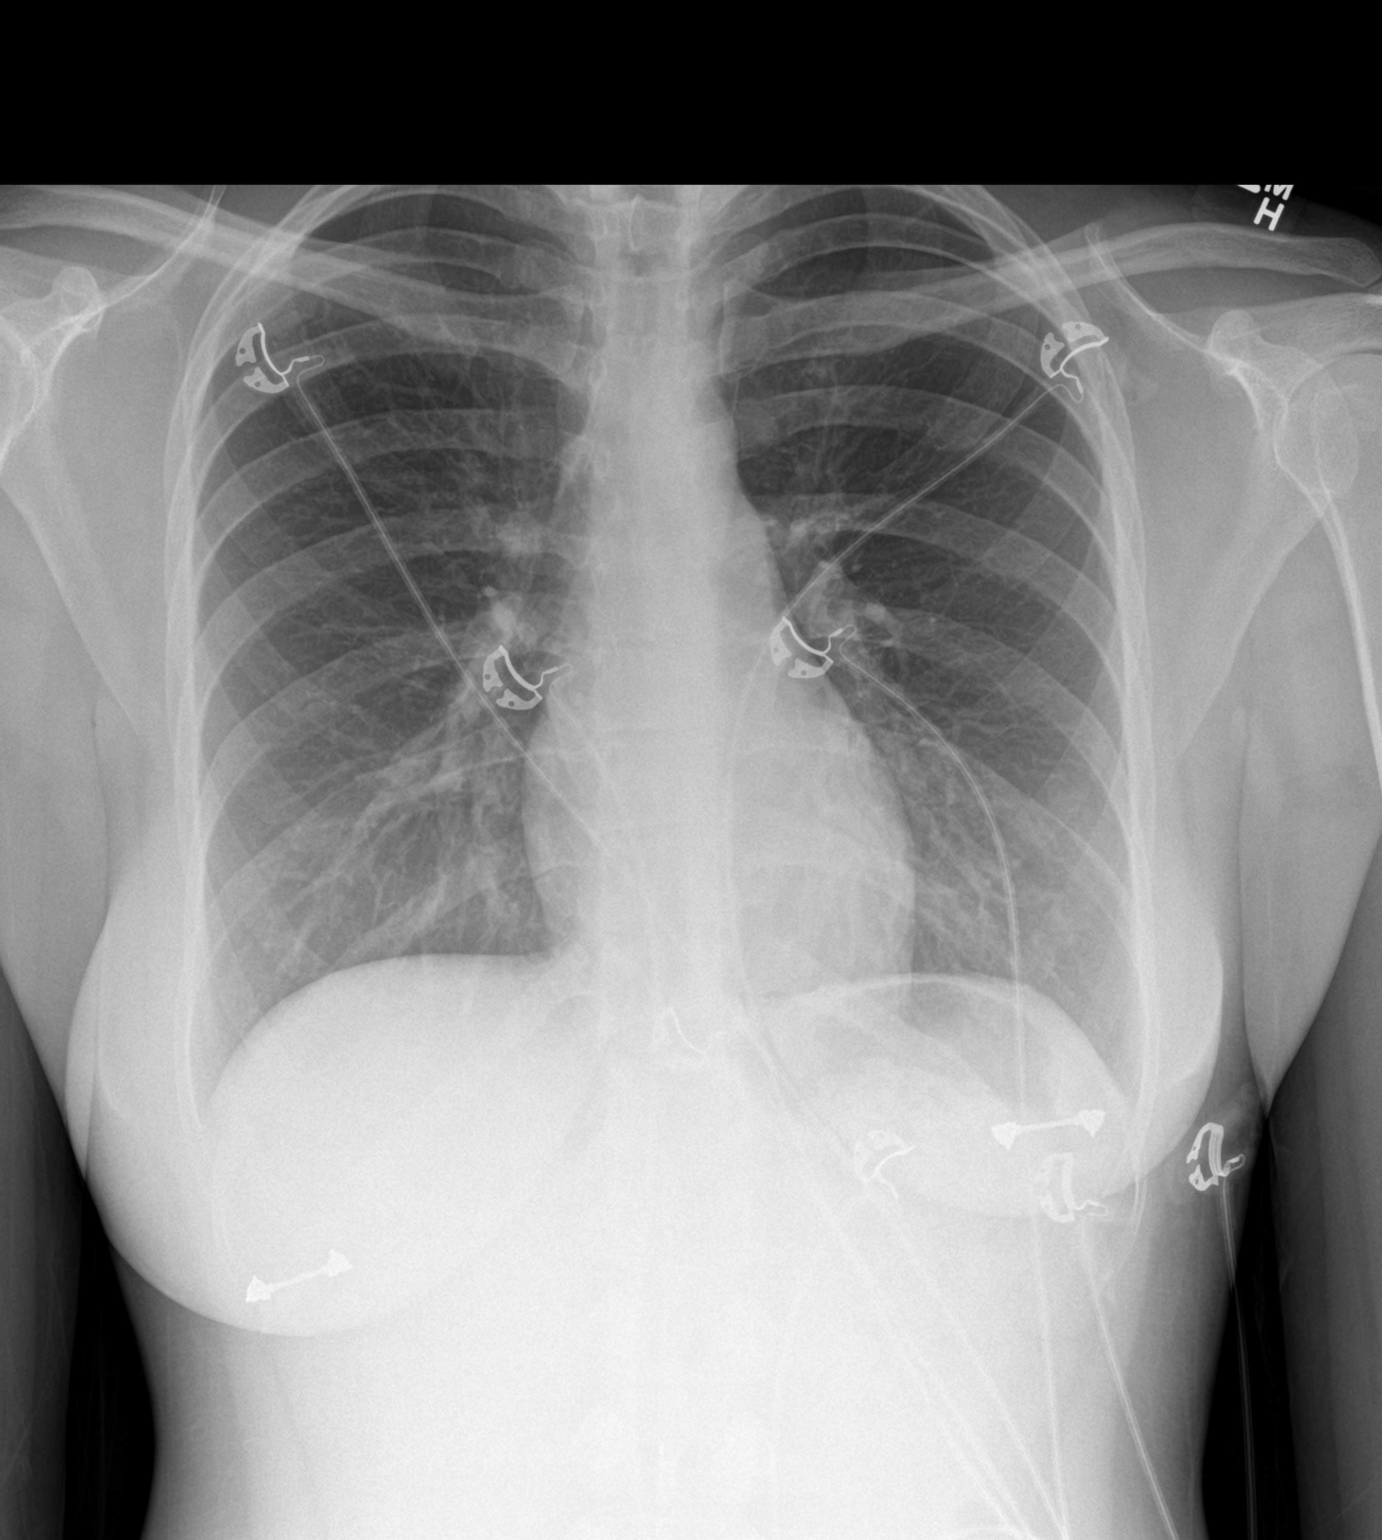

[chest lat]
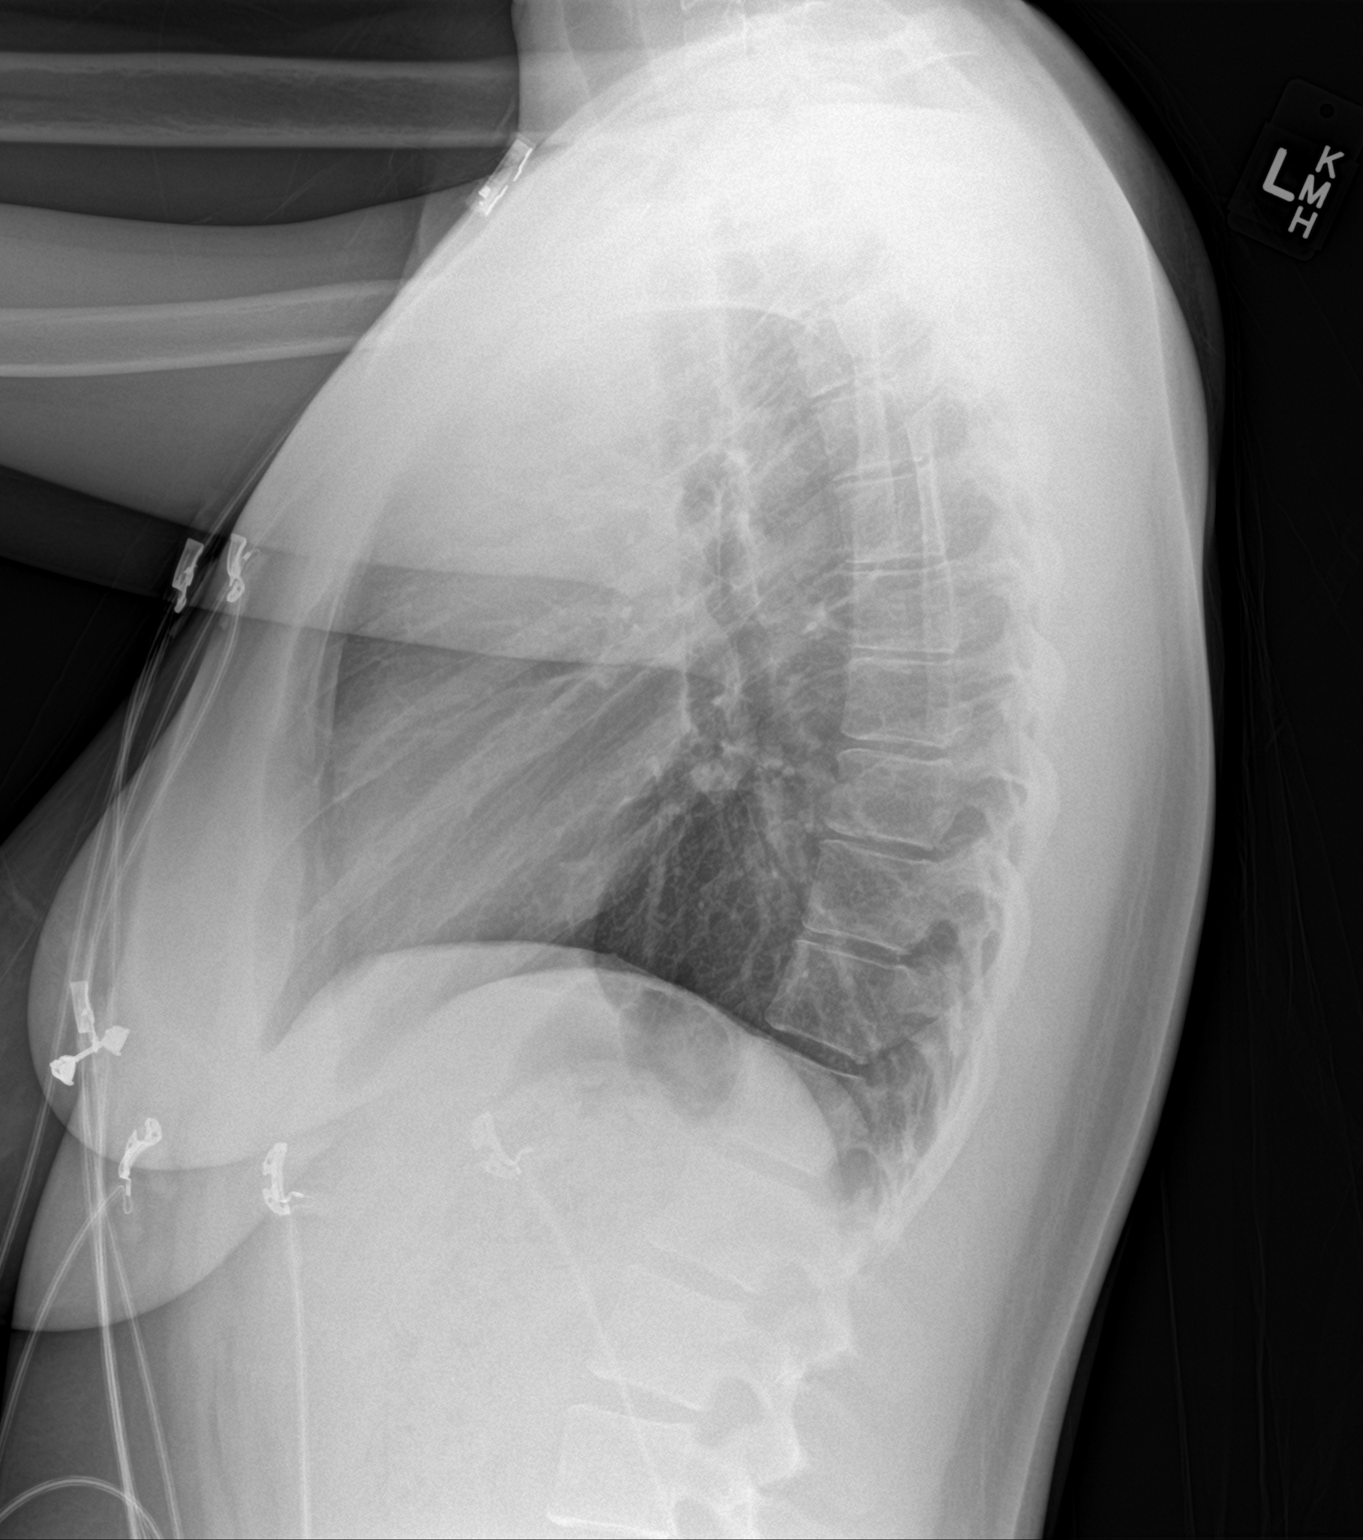

[2 of 2 positions shown; findings below may reference images not displayed]

FINDINGS: The heart size and mediastinal contours are within normal limits.
Both lungs are clear. The visualized skeletal structures are
unremarkable.
IMPRESSION: No active cardiopulmonary disease.

## 2022-03-20 DIAGNOSIS — Z01419 Encounter for gynecological examination (general) (routine) without abnormal findings: Secondary | ICD-10-CM | POA: Insufficient documentation

## 2022-03-20 NOTE — Progress Notes (Signed)
ANNUAL EXAM Patient name: Bonnie Madden MRN [id number]  Date of birth: 08-16-1995 Chief Complaint:   Gynecologic Exam  History of Present Illness:   Bonnie Madden is a 26 y.o. G0 w/ LMP 03/12/22 being seen today for a routine annual exam.  Current complaints: Worried about breast size differences between right and left breast.  Painful periods, but feels like it is at normal levels.   Patient's last menstrual period was 03/12/2022 (exact date).   Upstream - 03/24/22 0908       Pregnancy Intention Screening   Does the patient want to become pregnant in the next year? No    Does the patient's partner want to become pregnant in the next year? No    Would the patient like to discuss contraceptive options today? No      Contraception Wrap Up   Current Method Unknown/Not Reported   Same sex relationship   End Method --   Same sex relationship   Contraception Counseling Provided No    How was the end contraceptive method provided? N/A            The pregnancy intention screening data noted above was reviewed. Potential methods of contraception were discussed. The patient elected to proceed with -- (Same sex relationship).   Last pap Never.  Last mammogram: n/a. Family h/o breast cancer: Maternal great aunt with post menopausal breast cancer Last colonoscopy: n/a.      03/24/2022    9:02 AM  Depression screen PHQ 2/9  Decreased Interest 3  Down, Depressed, Hopeless 3  PHQ - 2 Score 6  Altered sleeping 0  Tired, decreased energy 2  Change in appetite 2  Feeling bad or failure about yourself  0  Trouble concentrating 3  Moving slowly or fidgety/restless 0  Suicidal thoughts 0  PHQ-9 Score 13       03/24/2022    9:02 AM  GAD 7 : Generalized Anxiety Score  Nervous, Anxious, on Edge 2  Control/stop worrying 1  Worry too much - different things 1  Trouble relaxing 1  Restless 3  Easily annoyed or irritable 2  Afraid - awful might happen 0  Total GAD 7 Score 10    Review of Systems:   Pertinent items are noted in HPI Denies any headaches, blurred vision, fatigue, shortness of breath, chest pain, abdominal pain, abnormal vaginal discharge/itching/odor/irritation, bowel movements, urination, or intercourse unless otherwise stated above. Pertinent History Reviewed:  Reviewed past medical,surgical, social and family history.  Reviewed problem list, medications and allergies. Physical Assessment:   Vitals:   03/24/22 0906  BP: 126/86  Pulse: 91  Weight: 120 lb 3.2 oz (54.5 kg)  Height: 5\' 8"  (1.727 m)  Body mass index is 18.28 kg/m.        Physical Examination:   General appearance - well appearing, and in no distress  Mental status - alert, oriented to person, place, and time  Psych:  She has a normal mood and affect  Skin - warm and dry, normal color, no suspicious lesions noted  Chest - effort normal, all lung fields clear to auscultation bilaterally  Heart - normal rate and regular rhythm  Neck:  midline trachea, no thyromegaly or nodules  Breasts - breasts appear normal, no suspicious masses, no skin or nipple changes or axillary nodes  Abdomen - soft, nontender, nondistended, no masses or organomegaly  Pelvic - VULVA: normal appearing vulva with no masses, tenderness or lesions  VAGINA: normal appearing vagina with normal  color and discharge, no lesions  CERVIX: normal appearing cervix without discharge or lesions, no CMT  Thin prep pap is done with reflex HPV  Extremities:  No swelling or varicosities noted  Chaperone present for exam  No results found for this or any previous visit (from the past 24 hour(s)).  Assessment & Plan:  1) Well-Woman Exam Pap collected with GC/CT Mammogram: @ 27yo, or sooner if problems Colonoscopy: @ 27yo, or sooner if problems Flu shot administered today  2) Positive depression screen Seen after patient left clinic, attempted to call patient to discuss, however she was at work so was unable to  discuss in detail. Was agreeable to MyChart message but is not currently registered.  Reminder sent to myself - will follow up with patient on Monday 12/4 to call patient back or send message if enrolled in MyChart  Orders Placed This Encounter  Procedures   Flu Vaccine QUAD 70mo+IM (Fluarix, Fluzone & Alfiuria Quad PF)  - Cytology - PAP  Meds: No orders of the defined types were placed in this encounter.   Follow-up: Return in about 1 year (around 03/25/2023) for annual exam; or sooner to discuss any concerns you may have.  Lennart Pall, MD 03/24/2022 10:33 AM

## 2022-03-20 NOTE — Patient Instructions (Addendum)
Your test results may take 1-2 weeks to come back. You can see your results on MyChart. Please send me a message or call our office if you have questions.

## 2022-03-24 ENCOUNTER — Encounter: Payer: Self-pay | Admitting: Obstetrics and Gynecology

## 2022-03-24 ENCOUNTER — Ambulatory Visit (INDEPENDENT_AMBULATORY_CARE_PROVIDER_SITE_OTHER): Payer: 59 | Admitting: Obstetrics and Gynecology

## 2022-03-24 ENCOUNTER — Other Ambulatory Visit (HOSPITAL_COMMUNITY)
Admission: RE | Admit: 2022-03-24 | Discharge: 2022-03-24 | Disposition: A | Discharge status: Home / Self Care | Payer: 59 | Source: Ambulatory Visit | Attending: Certified Nurse Midwife | Admitting: Certified Nurse Midwife

## 2022-03-24 ENCOUNTER — Other Ambulatory Visit: Payer: Self-pay

## 2022-03-24 VITALS — BP 126/86 | HR 91 | Ht 68.0 in | Wt 120.2 lb

## 2022-03-24 DIAGNOSIS — Z01419 Encounter for gynecological examination (general) (routine) without abnormal findings: Secondary | ICD-10-CM | POA: Diagnosis present

## 2022-03-24 DIAGNOSIS — Z1331 Encounter for screening for depression: Secondary | ICD-10-CM | POA: Diagnosis not present

## 2022-03-24 DIAGNOSIS — Z23 Encounter for immunization: Secondary | ICD-10-CM

## 2022-03-24 NOTE — Progress Notes (Signed)
Pt concerned with size of breast, states one is bigger than the other, advised that's more cosmetic.

## 2022-03-27 ENCOUNTER — Encounter: Payer: Self-pay | Admitting: Obstetrics and Gynecology

## 2022-03-29 LAB — CYTOLOGY - PAP
Chlamydia: NEGATIVE
Comment: NEGATIVE
Comment: NEGATIVE
Comment: NORMAL
Diagnosis: NEGATIVE
Neisseria Gonorrhea: NEGATIVE
Trichomonas: NEGATIVE

## 2022-04-13 ENCOUNTER — Ambulatory Visit: Payer: Medicaid Other | Attending: Internal Medicine | Admitting: Internal Medicine

## 2022-04-13 ENCOUNTER — Encounter: Payer: Self-pay | Admitting: Internal Medicine

## 2022-04-13 VITALS — BP 120/79 | HR 80 | Temp 98.4°F | Ht 68.0 in | Wt 199.0 lb

## 2022-04-13 DIAGNOSIS — E669 Obesity, unspecified: Secondary | ICD-10-CM

## 2022-04-13 DIAGNOSIS — F320 Major depressive disorder, single episode, mild: Secondary | ICD-10-CM | POA: Diagnosis not present

## 2022-04-13 DIAGNOSIS — J4530 Mild persistent asthma, uncomplicated: Secondary | ICD-10-CM

## 2022-04-13 DIAGNOSIS — Z833 Family history of diabetes mellitus: Secondary | ICD-10-CM

## 2022-04-13 DIAGNOSIS — Z8719 Personal history of other diseases of the digestive system: Secondary | ICD-10-CM | POA: Insufficient documentation

## 2022-04-13 LAB — POCT GLYCOSYLATED HEMOGLOBIN (HGB A1C): HbA1c, POC (controlled diabetic range): 5 % (ref 0.0–7.0)

## 2022-04-13 LAB — GLUCOSE, POCT (MANUAL RESULT ENTRY): POC Glucose: 110 mg/dl — AB (ref 70–99)

## 2022-04-13 MED ORDER — SERTRALINE HCL 25 MG PO TABS: 25.0000 mg | ORAL_TABLET | Freq: Every day | ORAL | 1 refills | Status: DC

## 2022-04-13 MED ORDER — BUDESONIDE-FORMOTEROL FUMARATE 80-4.5 MCG/ACT IN AERO: 2.0000 | INHALATION_SPRAY | Freq: Two times a day (BID) | RESPIRATORY_TRACT | 3 refills | Status: DC

## 2022-04-13 MED ORDER — ALBUTEROL SULFATE HFA 108 (90 BASE) MCG/ACT IN AERS: 2.0000 | INHALATION_SPRAY | Freq: Four times a day (QID) | RESPIRATORY_TRACT | 6 refills | Status: AC | PRN

## 2022-04-13 NOTE — Patient Instructions (Addendum)
I have given you printed sheet with information of behavioral health providers in the [place] area.  Please select a few of them and call for an appointment for counseling  Sertraline Tablets What is this medication? SERTRALINE (SER tra leen) treats depression, anxiety, obsessive-compulsive disorder (OCD), post-traumatic stress disorder (PTSD), and premenstrual dysphoric disorder (PMDD). It increases the amount of serotonin in the brain, a hormone that helps regulate mood. It belongs to a group of medications called SSRIs. This medicine may be used for other purposes; ask your health care provider or pharmacist if you have questions. COMMON BRAND NAME(S): Zoloft What should I tell my care team before I take this medication? They need to know if you have any of these conditions: Bleeding disorders Bipolar disorder or a family history of bipolar disorder Frequently drink alcohol Glaucoma Heart disease High blood pressure History of irregular heartbeat History of low levels of calcium, magnesium, or potassium in the blood Liver disease Receiving electroconvulsive therapy Seizures Suicidal thoughts, plans, or attempt by you or a family member Take medications that prevent or treat blood clots Thyroid disease An unusual or allergic reaction to sertraline, other medications, foods, dyes, or preservatives Pregnant or trying to get pregnant Breastfeeding How should I use this medication? Take this medication by mouth with a glass of water. Take it as directed on the prescription label at the same time every day. You can take it with or without food. If it upsets your stomach, take it with food. Do not take your medication more often than directed. Keep taking this medication unless your care team tells you to stop. Stopping it too quickly can cause serious side effects. It can also make your condition worse. A special MedGuide will be given to you by the pharmacist with each prescription and  refill. Be sure to read this information carefully each time. Talk to your care team about the use of this medication in children. While it may be prescribed for children as young as 7 years for selected conditions, precautions do apply. Overdosage: If you think you have taken too much of this medicine contact a poison control center or emergency room at once. NOTE: This medicine is only for you. Do not share this medicine with others. What if I miss a dose? If you miss a dose, take it as soon as you can. If it is almost time for your next dose, take only that dose. Do not take double or extra doses. What may interact with this medication? Do not take this medication with any of the following: Cisapride Dronedarone Linezolid MAOIs, such as Carbex, Eldepryl, Marplan, Nardil, and Parnate Methylene blue (injected into a vein) Pimozide Thioridazine This medication may also interact with the following: Alcohol Amphetamines Aspirin and aspirin-like medications Certain medications for fungal infections, such as ketoconazole, fluconazole, posaconazole, itraconazole Certain medications for irregular heart beat, such as flecainide, quinidine, propafenone Certain medications for mental health conditions Certain medications for migraine headaches, such as almotriptan, eletriptan, frovatriptan, naratriptan, rizatriptan, sumatriptan, zolmitriptan Certain medications for seizures, such as carbamazepine, valproic acid, phenytoin Certain medications for sleep Certain medications that prevent or treat blood clots, such as warfarin, enoxaparin, dalteparin Cimetidine Digoxin Diuretics Fentanyl Isoniazid Lithium NSAIDs, medications for pain and inflammation, such as ibuprofen or naproxen Other medications that cause heart rhythm changes, such as dofetilide Rasagiline Safinamide Supplements, such as St. John's wort, kava kava, valerian Tolbutamide Tramadol Tryptophan This list may not describe all  possible interactions. Give your health care provider a list  of all the medicines, herbs, non-prescription drugs, or dietary supplements you use. Also tell them if you smoke, drink alcohol, or use illegal drugs. Some items may interact with your medicine. What should I watch for while using this medication? Tell your care team if your symptoms do not get better or if they get worse. Visit your care team for regular checks on your progress. Because it may take several weeks to see the full effects of this medication, it is important to continue your treatment as prescribed by your care team. Patients and their families should watch out for new or worsening thoughts of suicide or depression. Also watch out for sudden changes in feelings such as feeling anxious, agitated, panicky, irritable, hostile, aggressive, impulsive, severely restless, overly excited and hyperactive, or not being able to sleep. If this happens, especially at the beginning of treatment or after a change in dose, call your care team. This medication may affect your coordination, reaction time, or judgment. Do not drive or operate machinery until you know how this medication affects you. Sit or stand up slowly to reduce the risk of dizzy or fainting spells. Drinking alcohol with this medication can increase the risk of these side effects. Your mouth may get dry. Chewing sugarless gum or sucking hard candy, and drinking plenty of water may help. Contact your care team if the problem does not go away or is severe. What side effects may I notice from receiving this medication? Side effects that you should report to your care team as soon as possible: Allergic reactions--skin rash, itching, hives, swelling of the face, lips, tongue, or throat Bleeding--bloody or black, tar-like stools, red or dark brown urine, vomiting blood or brown material that looks like coffee grounds, small red or purple spots on skin, unusual bleeding or bruising Heart  rhythm changes--fast or irregular heartbeat, dizziness, feeling faint or lightheaded, chest pain, trouble breathing Low sodium level--muscle weakness, fatigue, dizziness, headache, confusion Serotonin syndrome--irritability, confusion, fast or irregular heartbeat, muscle stiffness, twitching muscles, sweating, high fever, seizure, chills, vomiting, diarrhea Sudden eye pain or change in vision such as blurred vision, seeing halos around lights, vision loss Thoughts of suicide or self-harm, worsening mood Side effects that usually do not require medical attention (report these to your care team if they continue or are bothersome): Change in sex drive or performance Diarrhea Excessive sweating Nausea Tremors or shaking Upset stomach This list may not describe all possible side effects. Call your doctor for medical advice about side effects. You may report side effects to FDA at 1-800-FDA-1088. Where should I keep my medication? Keep out of the reach of children and pets. Store at room temperature between 20 and 25 degrees C (68 and 77 degrees F). Get rid of any unused medication after the expiration date. To get rid of medications that are no longer needed or expired: Take the medication to a medication take-back program. Check with your pharmacy or law enforcement to find a location. If you cannot return the medication, check the label or package insert to see if the medication should be thrown out in the garbage or flushed down the toilet. If you are not sure, ask your care team. If it is safe to put in the trash, empty the medication out of the container. Mix the medication with cat litter, dirt, coffee grounds, or other unwanted substance. Seal the mixture in a bag or container. Put it in the trash. NOTE: This sheet is a summary. It may not cover  all possible information. If you have questions about this medicine, talk to your doctor, pharmacist, or health care provider.  2023 Elsevier/Gold  Standard (2021-11-08 00:00:00)  Healthy Eating Following a healthy eating pattern may help you to achieve and maintain a healthy body weight, reduce the risk of chronic disease, and live a long and productive life. It is important to follow a healthy eating pattern at an appropriate calorie level for your body. Your nutritional needs should be met primarily through food by choosing a variety of nutrient-rich foods. What are tips for following this plan? Reading food labels Read labels and choose the following: Reduced or low sodium. Juices with 100% fruit juice. Foods with low saturated fats and high polyunsaturated and monounsaturated fats. Foods with whole grains, such as whole wheat, cracked wheat, brown rice, and wild rice. Whole grains that are fortified with folic acid. This is recommended for women who are pregnant or who want to become pregnant. Read labels and avoid the following: Foods with a lot of added sugars. These include foods that contain brown sugar, corn sweetener, corn syrup, dextrose, fructose, glucose, high-fructose corn syrup, honey, invert sugar, lactose, malt syrup, maltose, molasses, raw sugar, sucrose, trehalose, or turbinado sugar. Do not eat more than the following amounts of added sugar per day: 6 teaspoons (25 g) for women. 9 teaspoons (38 g) for men. Foods that contain processed or refined starches and grains. Refined grain products, such as white flour, degermed cornmeal, white bread, and white rice. Shopping Choose nutrient-rich snacks, such as vegetables, whole fruits, and nuts. Avoid high-calorie and high-sugar snacks, such as potato chips, fruit snacks, and candy. Use oil-based dressings and spreads on foods instead of solid fats such as butter, stick margarine, or cream cheese. Limit pre-made sauces, mixes, and "instant" products such as flavored rice, instant noodles, and ready-made pasta. Try more plant-protein sources, such as tofu, tempeh, black beans,  edamame, lentils, nuts, and seeds. Explore eating plans such as the Mediterranean diet or vegetarian diet. Cooking Use oil to saut or stir-fry foods instead of solid fats such as butter, stick margarine, or lard. Try baking, boiling, grilling, or broiling instead of frying. Remove the fatty part of meats before cooking. Steam vegetables in water or broth. Meal planning  At meals, imagine dividing your plate into fourths: One-half of your plate is fruits and vegetables. One-fourth of your plate is whole grains. One-fourth of your plate is protein, especially lean meats, poultry, eggs, tofu, beans, or nuts. Include low-fat dairy as part of your daily diet. Lifestyle Choose healthy options in all settings, including home, work, school, restaurants, or stores. Prepare your food safely: Wash your hands after handling raw meats. Keep food preparation surfaces clean by regularly washing with hot, soapy water. Keep raw meats separate from ready-to-eat foods, such as fruits and vegetables. Cook seafood, meat, poultry, and eggs to the recommended internal temperature. Store foods at safe temperatures. In general: Keep cold foods at 7F (4.4C) or below. Keep hot foods at 17F (60C) or above. Keep your freezer at [facility] (-17.8C) or below. Foods are no longer safe to eat when they have been between the temperatures of 40-17F (4.4-60C) for more than 2 hours. What foods should I eat? Fruits Aim to eat 2 cup-equivalents of fresh, canned (in natural juice), or frozen fruits each day. Examples of 1 cup-equivalent of fruit include 1 small apple, 8 large strawberries, 1 cup canned fruit,  cup dried fruit, or 1 cup 100% juice. Vegetables Aim to eat  2-3 cup-equivalents of fresh and frozen vegetables each day, including different varieties and colors. Examples of 1 cup-equivalent of vegetables include 2 medium carrots, 2 cups raw, leafy greens, 1 cup chopped vegetable (raw or cooked), or 1 medium  baked potato. Grains Aim to eat 6 ounce-equivalents of whole grains each day. Examples of 1 ounce-equivalent of grains include 1 slice of bread, 1 cup ready-to-eat cereal, 3 cups popcorn, or  cup cooked rice, pasta, or cereal. Meats and other proteins Aim to eat 5-6 ounce-equivalents of protein each day. Examples of 1 ounce-equivalent of protein include 1 egg, 1/2 cup nuts or seeds, or 1 tablespoon (16 g) peanut butter. A cut of meat or fish that is the size of a deck of cards is about 3-4 ounce-equivalents. Of the protein you eat each week, try to have at least 8 ounces come from seafood. This includes salmon, trout, herring, and anchovies. Dairy Aim to eat 3 cup-equivalents of fat-free or low-fat dairy each day. Examples of 1 cup-equivalent of dairy include 1 cup (240 mL) milk, 8 ounces (250 g) yogurt, 1 ounces (44 g) natural cheese, or 1 cup (240 mL) fortified soy milk. Fats and oils Aim for about 5 teaspoons (21 g) per day. Choose monounsaturated fats, such as canola and olive oils, avocados, peanut butter, and most nuts, or polyunsaturated fats, such as sunflower, corn, and soybean oils, walnuts, pine nuts, sesame seeds, sunflower seeds, and flaxseed. Beverages Aim for six 8-oz glasses of water per day. Limit coffee to three to five 8-oz cups per day. Limit caffeinated beverages that have added calories, such as soda and energy drinks. Limit alcohol intake to no more than 1 drink a day for nonpregnant women and 2 drinks a day for men. One drink equals 12 oz of beer (355 mL), 5 oz of wine (148 mL), or 1 oz of hard liquor (44 mL). Seasoning and other foods Avoid adding excess amounts of salt to your foods. Try flavoring foods with herbs and spices instead of salt. Avoid adding sugar to foods. Try using oil-based dressings, sauces, and spreads instead of solid fats. This information is based on general [place] nutrition guidelines. For more information, visit Build[employer].es. Exact amounts may  vary based on your nutrition needs. Summary A healthy eating plan may help you to maintain a healthy weight, reduce the risk of chronic diseases, and stay active throughout your life. Plan your meals. Make sure you eat the right portions of a variety of nutrient-rich foods. Try baking, boiling, grilling, or broiling instead of frying. Choose healthy options in all settings, including home, work, school, restaurants, or stores. This information is not intended to replace advice given to you by your health care provider. Make sure you discuss any questions you have with your health care provider. Document Revised: 09/21/2021 Document Reviewed: 12/07/2020 Elsevier Patient Education  [place].

## 2022-04-13 NOTE — Progress Notes (Signed)
Patient ID: Bonnie Madden, female    DOB: 07-20-1995  MRN: [id number]  CC: Establish Care (Est care / new patient. /BS concerns & need a inhaler refill. Dallie Piles received flu vax this season.)   Subjective: Bonnie Madden is a 26 y.o. female who presents for new pt visit Her concerns today include:   Last PCP was her pediatrician. Hx of asthma dx in childhood. Up to this point she used Albuterol PRN.  Used only when she had URI going on Recently started working out in gym and feels she needs to use Albuterol Uses treadmill and stepper, arm exercises - 30-60 mins Feels more SOB during her exercise. Sometimes she stops.  Sometimes at nights since it has been cold, she wakes up feeling chest tight about 2x/wk; but does not use her inhaler. Smoked in past in college socially.  Does not smoke any more.    Hx of Ulcerative Colitis.  Dx in middle school Recently saw Dr. Marca Ancona with [facility] and had c-scope 3 wks ago.  Told she is in remission.  Told just to take Align ProBiotics OTC.  Pos dep screen when she saw GYN earlier this mth.  Feels she lack energy at times.  Feels depress.  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  Reports poor work life balance; she has cut back working from 40 to 30 hrs a wk but still feels stressful.  Looking to change job.  Plans to look into getting in with a therapist now that she has insurance.  Use to see a therapist when she was first dx with UC. Found it helpful. Open to trying med for depression.  No SI/HI.  Never on med before.  Wants DM screen.  Fhx of DM in mom and GM.  Endorses frequent thirst She is working on trying to get her weight down which is one of the reasons she started going to the gym. A1C 5 BS 110.      Current Outpatient Medications on File Prior to Visit  Medication Sig Dispense Refill   albuterol (PROVENTIL HFA;VENTOLIN HFA) 108 (90 Base) MCG/ACT inhaler Inhale 2 puffs  into the lungs every 6 (six) hours as needed for wheezing or shortness of breath.     cetirizine (ZYRTEC) 10 MG tablet Take 10 mg by mouth daily as needed for allergies.     No current facility-administered medications on file prior to visit.    No Known Allergies  Social History   Socioeconomic History   Marital status: Single    Spouse name: Not on file   Number of children: Not on file   Years of education: Not on file   Highest education level: Not on file  Occupational History   Not on file  Tobacco Use   Smoking status: Never   Smokeless tobacco: Not on file  Vaping Use   Vaping Use: Never used  Substance and Sexual Activity   Alcohol use: Yes    Comment: At gatherings   Drug use: No   Sexual activity: Never    Birth control/protection: None  Other Topics Concern   Not on file  Social History Narrative   Not on file   Social Determinants of Health   Financial Resource Strain: Not on file  Food Insecurity: Not on file  Transportation Needs: Not on file  Physical Activity: Not on file  Stress: Not on file  Social Connections: Not on file  Intimate  Partner Violence: Not on file    Family History  Problem Relation Age of Onset   Diabetes type II Mother    Diabetes type II Maternal Grandmother     Past Surgical History:  Procedure Laterality Date   TONSILLECTOMY     WISDOM TOOTH EXTRACTION      ROS: Review of Systems Negative except as stated above  PHYSICAL EXAM: BP 120/79 (BP Location: Left Arm, Patient Position: Sitting, Cuff Size: Normal)   Pulse 80   Temp 98.4 F (36.9 C) (Oral)   Ht 5\' 8"  (1.727 m)   Wt 199 lb (90.3 kg)   LMP 03/12/2022 (Exact Date)   SpO2 100%   BMI 30.26 kg/m    Physical Exam  General appearance - alert, well appearing, young AAF and in no distress Mental status -patient tearful when talking about her job and increased stress.   Neck - supple, no significant adenopathy Chest - clear to auscultation, no wheezes,  rales or rhonchi, symmetric air entry Heart - normal rate, regular rhythm, normal S1, S2, no murmurs, rubs, clicks or gallops      Latest Ref Rng & Units 07/28/2015    1:49 PM  CMP  Glucose 65 - 99 mg/dL 09/27/2015   BUN 6 - 20 mg/dL 9   Creatinine 144 - 3.15 mg/dL 4.00   Sodium 8.67 - 619 mmol/L 141   Potassium 3.5 - 5.1 mmol/L 3.4   Chloride 101 - 111 mmol/L 108   CO2 22 - 32 mmol/L 24   Calcium 8.9 - 10.3 mg/dL 9.3   Total Protein 6.5 - 8.1 g/dL 8.1   Total Bilirubin 0.3 - 1.2 mg/dL 0.7   Alkaline Phos 38 - 126 U/L 43   AST 15 - 41 U/L 20   ALT 14 - 54 U/L 21    Lipid Panel  No results found for: "CHOL", "TRIG", "HDL", "CHOLHDL", "VLDL", "LDLCALC", "LDLDIRECT"  CBC    Component Value Date/Time   WBC 7.3 07/28/2015 1349   RBC 3.91 07/28/2015 1349   HGB 10.9 (L) 07/28/2015 1349   HCT 34.4 (L) 07/28/2015 1349   PLT 321 07/28/2015 1349   MCV 88.0 07/28/2015 1349   MCH 27.9 07/28/2015 1349   MCHC 31.7 07/28/2015 1349   RDW 12.4 07/28/2015 1349   Results for orders placed or performed in visit on 04/13/22  POCT glucose (manual entry)  Result Value Ref Range   POC Glucose 110 (A) 70 - 99 mg/dl  POCT glycosylated hemoglobin (Hb A1C)  Result Value Ref Range   Hemoglobin A1C     HbA1c POC (<> result, manual entry)     HbA1c, POC (prediabetic range)     HbA1c, POC (controlled diabetic range) 5.0 0.0 - 7.0 %    ASSESSMENT AND PLAN:  1. Mild persistent asthma without complication I recommend using albuterol inhaler 15 minutes before her workout routine.  Given that she is having some symptoms at night as well, I recommend starting Symbicort as a maintenance inhaler.  Went over the difference between albuterol as a rescue inhaler and Symbicort as maintenance. - albuterol (VENTOLIN HFA) 108 (90 Base) MCG/ACT inhaler; Inhale 2 puffs into the lungs every 6 (six) hours as needed for wheezing or shortness of breath.  Dispense: 18 g; Refill: 6 - budesonide-formoterol (SYMBICORT) 80-4.5  MCG/ACT inhaler; Inhale 2 puffs into the lungs 2 (two) times daily.  Dispense: 1 each; Refill: 3  2.  Major depressive disorder, single episode, mild -Discussed management of depression  with CBT plus or minus medication.  She feels she would benefit from both.  We will start her on low-dose of Zoloft.  Went over possible side effects and gave her printed information on the medication as well.  Advised that if she has any worsening depression or anxiety on the medication, she should stop the medicine and call me.  Otherwise we will have her follow-up with [place] in about 4 to 6 weeks.  We will start her on and call me.  Otherwise we will have her follow-up with [place] in about 4 to 6 weeks.  We will start her on a low-dose of 25 mg. -I have given her printed information of behavioral health providers in the area.  Advised to call 1 or 2 of them to try and get in with a counselor.  She is agreeable to doing so. - sertraline (ZOLOFT) 25 MG tablet; Take 1 tablet (25 mg total) by mouth daily.  Dispense: 30 tablet; Refill: 1  3. Obesity (BMI 30.0-34.9) Commended her on starting an exercise routine.  Goal is to get in about 150 minutes/week of moderate intensity exercise. Patient advised to eliminate sugary drinks from the diet, cut back on portion sizes especially of white carbohydrates, eat more white lean meat like chicken [place] and seafood instead of beef or pork and incorporate fresh fruits and vegetables into the diet daily.   4. Family history of diabetes mellitus (DM) A1c today is normal. - POCT glucose (manual entry) - POCT glycosylated hemoglobin (Hb A1C)  5. History of ulcerative colitis In remission   Patient was given the opportunity to ask questions.  Patient verbalized understanding of the plan and was able to repeat key elements of the plan.   This documentation was completed using Radio producer.  Any transcriptional errors are unintentional.  No orders of the defined  types were placed in this encounter.    Requested Prescriptions    No prescriptions requested or ordered in this encounter    No follow-ups on file.  Karle Plumber, MD, FACP

## 2022-04-26 ENCOUNTER — Other Ambulatory Visit: Payer: Self-pay | Admitting: Internal Medicine

## 2022-04-26 DIAGNOSIS — F320 Major depressive disorder, single episode, mild: Secondary | ICD-10-CM

## 2022-05-19 ENCOUNTER — Ambulatory Visit: Payer: Medicaid Other | Admitting: Internal Medicine

## 2022-08-04 ENCOUNTER — Ambulatory Visit: Payer: Medicaid Other | Admitting: Internal Medicine

## 2022-11-13 ENCOUNTER — Ambulatory Visit: Payer: Medicaid Other | Attending: Internal Medicine | Admitting: Internal Medicine

## 2022-11-13 ENCOUNTER — Encounter: Payer: Self-pay | Admitting: Internal Medicine

## 2022-11-13 VITALS — BP 125/88 | HR 90 | Temp 99.0°F | Ht 68.0 in | Wt 195.0 lb

## 2022-11-13 DIAGNOSIS — Z23 Encounter for immunization: Secondary | ICD-10-CM | POA: Diagnosis not present

## 2022-11-13 DIAGNOSIS — J4520 Mild intermittent asthma, uncomplicated: Secondary | ICD-10-CM | POA: Diagnosis not present

## 2022-11-13 DIAGNOSIS — R221 Localized swelling, mass and lump, neck: Secondary | ICD-10-CM

## 2022-11-13 DIAGNOSIS — F320 Major depressive disorder, single episode, mild: Secondary | ICD-10-CM

## 2022-11-13 NOTE — Progress Notes (Signed)
Patient ID: Bonnie Madden, female    DOB: 02/05/1996  MRN: [id number]  CC: Asthma (Asthma f/u. Nicki Reaper not starting sertraline. /Intermittent swelling of L jaw & neck X1 yr/Yes to Tdap)   Subjective: Bonnie Madden is a 27 y.o. female who presents for chronic ds management Her concerns today include:  Hx of asthma, ulcerative colitis (Dr. Marca Ancona - last c-scope 03/2022) MDD,   Mild Persistent Asthma: On last visit, we had prescribed albuterol and Symbicort. Uses Albuterol only.  Has to use it before exercise.  MDD:  never filled the rxn for the Zoloft went to therapy instead.  Sees therapist Q 3 wks and finds it helpful.   Got a promotion at her job.  Finds new position less stressful  Notice a swelling in LT submandibular area intermittent x 1 yr. Occurs when she is eating.  Last episode occurred last wk.  Can last 10-15 mins.  She stops eating and it goes away.   No problems swallowing or breathing when it occurs.  She wonders whether it is caused by an allergy to foods  Patient Active Problem List   Diagnosis Date Noted   Mild persistent asthma without complication 04/13/2022   Major depressive disorder, single episode, mild (HCC) 04/13/2022   Obesity (BMI 30.0-34.9) 04/13/2022   History of ulcerative colitis 04/13/2022     Current Outpatient Medications on File Prior to Visit  Medication Sig Dispense Refill   albuterol (VENTOLIN HFA) 108 (90 Base) MCG/ACT inhaler Inhale 2 puffs into the lungs every 6 (six) hours as needed for wheezing or shortness of breath. 18 g 6   budesonide-formoterol (SYMBICORT) 80-4.5 MCG/ACT inhaler Inhale 2 puffs into the lungs 2 (two) times daily. 1 each 3   cetirizine (ZYRTEC) 10 MG tablet Take 10 mg by mouth daily as needed for allergies.     sertraline (ZOLOFT) 25 MG tablet TAKE 1 TABLET (25 MG TOTAL) BY MOUTH DAILY. (Patient not taking: Reported on 11/13/2022) 30 tablet 0   No current facility-administered medications on file prior to visit.     No Known Allergies  Social History   Socioeconomic History   Marital status: Single    Spouse name: Not on file   Number of children: Not on file   Years of education: Not on file   Highest education level: Not on file  Occupational History   Not on file  Tobacco Use   Smoking status: Never   Smokeless tobacco: Not on file  Vaping Use   Vaping status: Never Used  Substance and Sexual Activity   Alcohol use: Yes    Comment: At gatherings   Drug use: No   Sexual activity: Never    Birth control/protection: None  Other Topics Concern   Not on file  Social History Narrative   Not on file   Social Determinants of Health   Financial Resource Strain: Not on file  Food Insecurity: Not on file  Transportation Needs: Not on file  Physical Activity: Not on file  Stress: Not on file  Social Connections: Unknown (06/07/2022)   Received from [facility]   Social Network    Social Network: Not on file  Intimate Partner Violence: Unknown (06/07/2022)   Received from [facility]   HITS    Physically Hurt: Not on file    Insult or Talk Down To: Not on file    Threaten Physical Harm: Not on file    Scream or Curse: Not on  file    Family History  Problem Relation Age of Onset   Diabetes type II Mother    Diabetes type II Maternal Grandmother     Past Surgical History:  Procedure Laterality Date   TONSILLECTOMY     WISDOM TOOTH EXTRACTION      ROS: Review of Systems Negative except as stated above  PHYSICAL EXAM: BP 125/88 (BP Location: Left Arm, Patient Position: Sitting, Cuff Size: Normal)   Pulse 90   Temp 99 F (37.2 C) (Oral)   Ht 5\' 8"  (1.727 m)   Wt 195 lb (88.5 kg)   SpO2 100%   BMI 29.65 kg/m   Wt Readings from Last 3 Encounters:  11/13/22 195 lb (88.5 kg)  04/13/22 199 lb (90.3 kg)  03/24/22 120 lb 3.2 oz (54.5 kg)    Physical Exam  General appearance - alert, well appearing, young African-American female and  in no distress Mental status - normal mood, behavior, speech, dress, motor activity, and thought processes Nose - normal and patent, no erythema, discharge or polyps Mouth - mucous membranes moist, pharynx normal without lesions Neck -no cervical axillary lymphadenopathy.  No enlargement/swelling of the submandibular glands.  Thyroid does not appear enlarged.  No thyroid nodules appreciated. Chest - clear to auscultation, no wheezes, rales or rhonchi, symmetric air entry Heart - normal rate, regular rhythm, normal S1, S2, no murmurs, rubs, clicks or gallops     11/13/2022    3:57 PM 04/13/2022    9:25 AM 03/24/2022    9:02 AM  Depression screen PHQ 2/9  Decreased Interest 1 1 3   Down, Depressed, Hopeless 2 1 3   PHQ - 2 Score 3 2 6   Altered sleeping 1 1 0  Tired, decreased energy 1 2 2   Change in appetite 2 2 2   Feeling bad or failure about yourself  0 2 0  Trouble concentrating 2 1 3   Moving slowly or fidgety/restless 1 0 0  Suicidal thoughts 0 0 0  PHQ-9 Score 10 10 13        Latest Ref Rng & Units 07/28/2015    1:49 PM  CMP  Glucose 65 - 99 mg/dL 638   BUN 6 - 20 mg/dL 9   Creatinine 7.56 - 4.33 mg/dL 2.95   Sodium 188 - 416 mmol/L 141   Potassium 3.5 - 5.1 mmol/L 3.4   Chloride 101 - 111 mmol/L 108   CO2 22 - 32 mmol/L 24   Calcium 8.9 - 10.3 mg/dL 9.3   Total Protein 6.5 - 8.1 g/dL 8.1   Total Bilirubin 0.3 - 1.2 mg/dL 0.7   Alkaline Phos 38 - 126 U/L 43   AST 15 - 41 U/L 20   ALT 14 - 54 U/L 21    Lipid Panel  No results found for: "CHOL", "TRIG", "HDL", "CHOLHDL", "VLDL", "LDLCALC", "LDLDIRECT"  CBC    Component Value Date/Time   WBC 7.3 07/28/2015 1349   RBC 3.91 07/28/2015 1349   HGB 10.9 (L) 07/28/2015 1349   HCT 34.4 (L) 07/28/2015 1349   PLT 321 07/28/2015 1349   MCV 88.0 07/28/2015 1349   MCH 27.9 07/28/2015 1349   MCHC 31.7 07/28/2015 1349   RDW 12.4 07/28/2015 1349    ASSESSMENT AND PLAN:  1. Mild intermittent asthma without  complication Controlled with just intermittent use of albuterol mainly prior to physical activity.  2. Mild major depression (HCC) Doing well with therapy sessions.  Never needed the Zoloft.  Medication taken off med  list.  3. Neck fullness I did not appreciate any mass or abnormality on examination of the head and neck.  Advised patient that we can pursue CAT scan of the soft tissue of the neck to see if there is any underlying mass on the left side below the jaw.  She wanted to check with her insurance first about this before committing to doing it.  4. Need for Tdap vaccination Given today.    Patient was given the opportunity to ask questions.  Patient verbalized understanding of the plan and was able to repeat key elements of the plan.   This documentation was completed using Paediatric nurse.  Any transcriptional errors are unintentional.  No orders of the defined types were placed in this encounter.    Requested Prescriptions    No prescriptions requested or ordered in this encounter    No follow-ups on file.  Jonah Blue, MD, FACP

## 2023-11-16 ENCOUNTER — Telehealth: Payer: Self-pay | Admitting: Internal Medicine

## 2023-11-16 NOTE — Telephone Encounter (Signed)
 Called pt to confirm appt for 7/28 unable to LVM

## 2023-11-19 ENCOUNTER — Ambulatory Visit: Payer: PRIVATE HEALTH INSURANCE | Attending: Internal Medicine | Admitting: Internal Medicine

## 2023-11-19 ENCOUNTER — Encounter: Payer: Self-pay | Admitting: Internal Medicine

## 2023-11-19 VITALS — BP 110/80 | HR 89 | Temp 98.5°F | Ht 68.0 in | Wt 202.0 lb

## 2023-11-19 DIAGNOSIS — F419 Anxiety disorder, unspecified: Secondary | ICD-10-CM | POA: Diagnosis not present

## 2023-11-19 DIAGNOSIS — J4520 Mild intermittent asthma, uncomplicated: Secondary | ICD-10-CM

## 2023-11-19 DIAGNOSIS — F321 Major depressive disorder, single episode, moderate: Secondary | ICD-10-CM | POA: Diagnosis not present

## 2023-11-19 DIAGNOSIS — Z23 Encounter for immunization: Secondary | ICD-10-CM

## 2023-11-19 DIAGNOSIS — N920 Excessive and frequent menstruation with regular cycle: Secondary | ICD-10-CM | POA: Diagnosis not present

## 2023-11-19 DIAGNOSIS — E66811 Obesity, class 1: Secondary | ICD-10-CM

## 2023-11-19 DIAGNOSIS — N946 Dysmenorrhea, unspecified: Secondary | ICD-10-CM

## 2023-11-19 DIAGNOSIS — Z0000 Encounter for general adult medical examination without abnormal findings: Secondary | ICD-10-CM | POA: Diagnosis not present

## 2023-11-19 DIAGNOSIS — D649 Anemia, unspecified: Secondary | ICD-10-CM

## 2023-11-19 DIAGNOSIS — Z6830 Body mass index (BMI) 30.0-30.9, adult: Secondary | ICD-10-CM

## 2023-11-19 DIAGNOSIS — E6609 Other obesity due to excess calories: Secondary | ICD-10-CM

## 2023-11-19 MED ORDER — SERTRALINE HCL 25 MG PO TABS: 25.0000 mg | ORAL_TABLET | Freq: Every day | ORAL | 0 refills | Status: DC

## 2023-11-19 NOTE — Progress Notes (Addendum)
 "   Patient ID: Bonnie Madden, female    DOB: 15-Jun-1995  MRN: [id number]  CC: Annual Exam (Physical. /Requesting referral to gynecology - Experiencing heavy blood flow, intermittent light to heavy spotting in between cycles, hx of bc in middle school for cycle regulation, cramping/Pneumonia vax administered on 11/19/2023 - C.A.)   Subjective: Bonnie Madden is a 28 y.o. female who presents for chronic ds management. Her concerns today include:  Hx of asthma, ulcerative colitis (Dr. Saintclair - last c-scope 03/2022) MDD,   Discussed the use of AI scribe software for clinical note transcription with the patient, who gave verbal consent to proceed.  History of Present Illness Bonnie Madden is a 28 year old female who presents for an annual physical exam and evaluation of heavy menstrual bleeding.  She has experienced heavy menstrual bleeding since middle school, with periods lasting seven to eight days and heavy flow for the first five days. She uses overnight thick pads, changing them two to three times a day, and reports passing clots and significant cramps. Her mother had a hysterectomy at age 76. She previously used birth control to regulate her cycles while in high school but is not currently on any and prefers to avoid it. She uses acetaminophen  for pain relief but avoids ibuprofen due to past stomach issues.  She has a history of depression and anxiety, which worsens during colder, darker months. She has not seen a therapist recently and did not start a previously prescribed medication. She feels more depressed than anxious and denies suicidal ideation, though she sometimes feels 'tired' and 'kind of don'twant to be here'.  She has a history of ulcerative colitis, with no recent flares.  She reports seeing her gastroenterologist about a year ago and she recommended that she takes a daily probiotic which she has been doing.    She mentions occasional chest pain, which she attributes to anxiety,  and has not had recent asthma flares. She uses albuterol  as needed, particularly when she was more active at the gym.  She reports weight gain from 195 to 202 pounds since her last visit in July of the previous year, attributing it to overeating related to her depression. She smokes and drinks alcohol socially and has not been exercising recently.    Patient Active Problem List   Diagnosis Date Noted   Mild persistent asthma without complication 04/13/2022   Major depressive disorder, single episode, mild (HCC) 04/13/2022   Obesity (BMI 30.0-34.9) 04/13/2022   History of ulcerative colitis 04/13/2022     Current Outpatient Medications on File Prior to Visit  Medication Sig Dispense Refill   albuterol  (VENTOLIN  HFA) 108 (90 Base) MCG/ACT inhaler Inhale 2 puffs into the lungs every 6 (six) hours as needed for wheezing or shortness of breath. 18 g 6   cetirizine (ZYRTEC) 10 MG tablet Take 10 mg by mouth daily as needed for allergies.     No current facility-administered medications on file prior to visit.    No Known Allergies  Social History   Socioeconomic History   Marital status: Single    Spouse name: Not on file   Number of children: Not on file   Years of education: Not on file   Highest education level: Not on file  Occupational History   Not on file  Tobacco Use   Smoking status: Never   Smokeless tobacco: Not on file  Vaping Use   Vaping status: Never Used  Substance and Sexual Activity   Alcohol  use: Yes    Comment: At gatherings   Drug use: No   Sexual activity: Never    Birth control/protection: None  Other Topics Concern   Not on file  Social History Narrative   Not on file   Social Drivers of Health   Financial Resource Strain: Medium Risk (11/19/2023)   Overall Financial Resource Strain (CARDIA)    Difficulty of Paying Living Expenses: Somewhat hard  Food Insecurity: Food Insecurity Present (11/19/2023)   Hunger Vital Sign    Worried About Running  Out of Food in the Last Year: Sometimes true    Ran Out of Food in the Last Year: Sometimes true  Transportation Needs: No Transportation Needs (11/19/2023)   PRAPARE - Administrator, Civil Service (Medical): No    Lack of Transportation (Non-Medical): No  Physical Activity: Inactive (11/19/2023)   Exercise Vital Sign    Days of Exercise per Week: 0 days    Minutes of Exercise per Session: 0 min  Stress: No Stress Concern Present (11/19/2023)   [name] - Occupational Stress Questionnaire    Feeling of Stress: Not at all  Social Connections: Socially Isolated (11/19/2023)   Social Connection and Isolation Panel    Frequency of Communication with Friends and Family: Once a week    Frequency of Social Gatherings with Friends and Family: Never    Attends Religious Services: Never    Database Administrator or Organizations: No    Attends Banker Meetings: Never    Marital Status: Never married  Intimate Partner Violence: Not At Risk (11/19/2023)   Humiliation, Afraid, Rape, and Kick questionnaire    Fear of Current or Ex-Partner: No    Emotionally Abused: No    Physically Abused: No    Sexually Abused: No    Family History  Problem Relation Age of Onset   Diabetes type II Mother    Diabetes type II Maternal Grandmother     Past Surgical History:  Procedure Laterality Date   TONSILLECTOMY     WISDOM TOOTH EXTRACTION      ROS: Review of Systems  HENT:  Negative for dental problem, hearing loss and trouble swallowing.   Eyes:  Negative for visual disturbance.       Overdue for eye exam  Respiratory:  Negative for cough and shortness of breath.   Gastrointestinal:  Negative for blood in stool.  Genitourinary:  Negative for difficulty urinating.   PHYSICAL EXAM: BP 110/80   Pulse 89   Temp 98.5 F (36.9 C) (Oral)   Ht 5' 8 (1.727 m)   Wt 202 lb (91.6 kg)   SpO2 100%   BMI 30.71 kg/m   Wt Readings from Last 3  Encounters:  11/19/23 202 lb (91.6 kg)  11/13/22 195 lb (88.5 kg)  04/13/22 199 lb (90.3 kg)    Physical Exam Constitutional: Young African-American female obese in NAD.   Mental: Patient tearful when talking about her menstrual cycles and depression/anxiety Head: Normocephalic. Atraumatic Ears: External right and left ear normal.   Eyes: Conjunctivae and EOM are normal. PERRLA, no scleral icterus.  Mouth: no oral lesions, good oral hygiene, throat clear without exudates Neck: Neck supple.  No tracheal deviation. No thyromegaly. No cervical LN CVS: RRR, S1/S2 +, no murmurs, no gallops, no carotid bruit. No JVD Pulmonary: Effort and breath sounds normal, no stridor, rhonchi, wheezes, rales.  Abdominal: Soft. BS +,  no distension, tenderness, rebound or guarding. No  organomegaly or masses Musculoskeletal: Normal range of motion. No edema and no tenderness.  Neuro: Alert and oriented x3.  Cns grossly intact, she actively moves all extremities. Skin: Skin is warm and dry. No rash noted. Not diaphoretic. No erythema. No pallor.       11/19/2023    4:00 PM 11/13/2022    3:57 PM 04/13/2022    9:25 AM  Depression screen PHQ 2/9  Decreased Interest 2 1 1   Down, Depressed, Hopeless 2 2 1   PHQ - 2 Score 4 3 2   Altered sleeping 3 1 1   Tired, decreased energy 2 1 2   Change in appetite 3 2 2   Feeling bad or failure about yourself  0 0 2  Trouble concentrating 2 2 1   Moving slowly or fidgety/restless 0 1 0  Suicidal thoughts 1 0 0  PHQ-9 Score 15 10 10   Difficult doing work/chores Extremely dIfficult        11/19/2023    4:00 PM 11/13/2022    3:58 PM 04/13/2022    9:25 AM 03/24/2022    9:02 AM  GAD 7 : Generalized Anxiety Score  Nervous, Anxious, on Edge 2 1 1 2   Control/stop worrying 1 0 2 1  Worry too much - different things 2 1 2 1   Trouble relaxing 1 0 1 1  Restless 0 2 0 3  Easily annoyed or irritable 3 2 1 2   Afraid - awful might happen 1 1 1  0  Total GAD 7 Score 10 7 8 10    Anxiety Difficulty Very difficult           Latest Ref Rng & Units 07/28/2015    1:49 PM  CMP  Glucose 65 - 99 mg/dL 899   BUN 6 - 20 mg/dL 9   Creatinine 9.55 - 8.99 mg/dL 9.46   Sodium 864 - 854 mmol/L 141   Potassium 3.5 - 5.1 mmol/L 3.4   Chloride 101 - 111 mmol/L 108   CO2 22 - 32 mmol/L 24   Calcium 8.9 - 10.3 mg/dL 9.3   Total Protein 6.5 - 8.1 g/dL 8.1   Total Bilirubin 0.3 - 1.2 mg/dL 0.7   Alkaline Phos 38 - 126 U/L 43   AST 15 - 41 U/L 20   ALT 14 - 54 U/L 21    Lipid Panel  No results found for: CHOL, TRIG, HDL, CHOLHDL, VLDL, LDLCALC, LDLDIRECT  CBC    Component Value Date/Time   WBC 7.3 07/28/2015 1349   RBC 3.91 07/28/2015 1349   HGB 10.9 (L) 07/28/2015 1349   HCT 34.4 (L) 07/28/2015 1349   PLT 321 07/28/2015 1349   MCV 88.0 07/28/2015 1349   MCH 27.9 07/28/2015 1349   MCHC 31.7 07/28/2015 1349   RDW 12.4 07/28/2015 1349    ASSESSMENT AND PLAN: 1. Annual physical exam (Primary) Discussed the importance of routine dental cleaning twice a year.  She plans to get established with a dentist.  Discussed the importance of getting routine eye exam at least once every 2 years. -Advised not to smoke cigarettes at all due to its adverse effects on oneself.  2. Dysmenorrhea 3. Menorrhagia with regular cycle Recommend taking ibuprofen 600 mg 3 times a day as needed during her cycles to help decrease the pain.  Sometimes this also helps to decrease flow.  I recommend she consider being placed on birth control pills to help decrease bleeding but patient does not want to pursue that at this time.  She  requests referral to GYN - Ambulatory referral to Gynecology - CBC  4. Moderate major depression (HCC) Discussed management.  She is agreeable to referral to behavioral health for medication management and counseling.  In the meantime she is agreeable to starting Zoloft .  We will start her on a low-dose of 25 mg daily for 1 month and then increase to 50  mg if she is tolerating it.  Advised to be seen in the emergency room if she experiences any worsening depression, worsening anxiety or suicidal ideation.  She expressed understanding - Ambulatory referral to Psychiatry - sertraline  (ZOLOFT ) 25 MG tablet; Take 1 tablet (25 mg total) by mouth daily.  Dispense: 30 tablet; Refill: 0  5. Anxiety disorder, unspecified type See #4 above - Ambulatory referral to Psychiatry - sertraline  (ZOLOFT ) 25 MG tablet; Take 1 tablet (25 mg total) by mouth daily.  Dispense: 30 tablet; Refill: 0  6. Mild intermittent asthma without complication Continue albuterol  as needed  7. Class 1 obesity due to excess calories without serious comorbidity with body mass index (BMI) of 30.0 to 30.9 in adult Patient advised to eliminate sugary drinks from the diet, cut back on portion sizes especially of white carbohydrates, eat more white lean meat like chicken turkey and seafood instead of beef or pork and incorporate fresh fruits and vegetables into the diet daily. Encourage some form of regular exercise at least 3 to 5 days a week for 30 minutes.  Discussed how exercise can help with managing depression/anxiety.  8. Need for vaccination against Streptococcus pneumoniae - Pneumococcal conjugate vaccine 20-valent  Addendum 11/20/2023: CBC showing H/H in the low normal range.  Will add iron studies.  Patient was given the opportunity to ask questions.  Patient verbalized understanding of the plan and was able to repeat key elements of the plan.   This documentation was completed using Paediatric nurse.  Any transcriptional errors are unintentional.  Orders Placed This Encounter  Procedures   Pneumococcal conjugate vaccine 20-valent   CBC   Ambulatory referral to Gynecology   Ambulatory referral to Psychiatry     Requested Prescriptions   Signed Prescriptions Disp Refills   sertraline  (ZOLOFT ) 25 MG tablet 30 tablet 0    Sig: Take 1 tablet (25  mg total) by mouth daily.    Return in about 5 weeks (around 12/24/2023) for nurse visit.  Barnie Louder, MD, FACP "

## 2023-11-19 NOTE — Patient Instructions (Addendum)
 VISIT SUMMARY:  Today, you came in for your annual physical exam and to discuss your heavy menstrual bleeding. We also reviewed your history of depression, ulcerative colitis, asthma, and recent weight gain. We discussed various treatment options and made plans for follow-up care.  YOUR PLAN:  -MENORRHAGIA: Menorrhagia means heavy menstrual bleeding. You have experienced this since middle school, with periods lasting seven to eight days and heavy flow for the first five days. We discussed non-hormonal treatment options and pain relief. I recommend taking ibuprofen 600 mg three times daily during your menstruation. I am also referring you to a gynecologist for further evaluation and ordering a blood test to check for anemia.  -DEPRESSION: Depression is a mood disorder that causes persistent feelings of sadness and loss of interest. Your symptoms worsen during colder months. We discussed starting medication, and I have prescribed sertraline  25 mg daily for one month, then increasing to 50 mg. I am also referring you to behavioral health for psychotherapy. We will follow up in one month to see how the medication is working for you.  If you develop any thoughts of self-harm or worsening anxiety/depression, please be seen in the emergency room.  -ULCERATIVE COLITIS: Ulcerative colitis is a chronic condition that causes inflammation in the digestive tract. You are currently managing it with daily probiotics and have not had any recent flare-ups. No changes to your current management are needed at this time.  -ASTHMA: Asthma is a condition in which your airways narrow and swell, producing extra mucus. You have occasional chest pain likely related to anxiety and use albuterol  as needed. There have been no recent asthma flare-ups, so continue with your current management.  -GENERAL HEALTH MAINTENANCE: We discussed the importance of routine health maintenance. You are due for a pneumococcal vaccine, which we  administered today. I encourage you to have biennial eye exams and biannual dental cleanings. Additionally, I advise maintaining a balanced diet, regular physical activity, abstaining from smoking, and limiting alcohol to two drinks socially.  INSTRUCTIONS:  Please follow up with the gynecologist for your heavy menstrual bleeding and with behavioral health for psychotherapy. We will have a follow-up appointment in one month to assess the effectiveness of your depression medication. Additionally, please complete the blood test to check for anemia.

## 2023-11-20 ENCOUNTER — Ambulatory Visit: Payer: Self-pay | Admitting: Internal Medicine

## 2023-11-20 ENCOUNTER — Encounter: Payer: Self-pay | Admitting: Internal Medicine

## 2023-11-20 ENCOUNTER — Other Ambulatory Visit: Payer: Self-pay | Admitting: Internal Medicine

## 2023-11-20 DIAGNOSIS — D649 Anemia, unspecified: Secondary | ICD-10-CM

## 2023-11-20 LAB — CBC
Hematocrit: 36.2 % (ref 34.0–46.6)
Hemoglobin: 11.2 g/dL (ref 11.1–15.9)
MCH: 27.6 pg (ref 26.6–33.0)
MCHC: 30.9 g/dL — ABNORMAL LOW (ref 31.5–35.7)
MCV: 89 fL (ref 79–97)
Platelets: 386 x10E3/uL (ref 150–450)
RBC: 4.06 x10E6/uL (ref 3.77–5.28)
RDW: 11.4 % — ABNORMAL LOW (ref 11.7–15.4)
WBC: 9.1 x10E3/uL (ref 3.4–10.8)

## 2023-11-20 NOTE — Addendum Note (Signed)
 Addended by: VICCI SOBER B on: 11/20/2023 07:30 AM   Modules accepted: Orders

## 2023-11-23 ENCOUNTER — Ambulatory Visit: Payer: PRIVATE HEALTH INSURANCE | Attending: Internal Medicine

## 2023-11-23 DIAGNOSIS — D649 Anemia, unspecified: Secondary | ICD-10-CM

## 2023-11-24 ENCOUNTER — Ambulatory Visit: Payer: Self-pay | Admitting: Internal Medicine

## 2023-11-24 LAB — IRON,TIBC AND FERRITIN PANEL
Ferritin: 57 ng/mL (ref 15–150)
Iron Saturation: 13 % — ABNORMAL LOW (ref 15–55)
Iron: 48 ug/dL (ref 27–159)
Total Iron Binding Capacity: 381 ug/dL (ref 250–450)
UIBC: 333 ug/dL (ref 131–425)

## 2023-12-03 NOTE — Progress Notes (Signed)
 GYNECOLOGY  VISIT   HPI: Bonnie Madden is a 28 y.o.  single female G0P0 here for heavy menstrual bleeding onset last year, at times soaking through 3 full-sized pads in a day. She started taking oral iron last week. Periods are monthly, occurring every 28 days with duration of 7-8 days, and no intermenstrual bleeding. She used OCPs for heavy periods when she was younger without relief. Endorses associated cramping for first three days of menses, breast tenderness, mood changes, and general constipation unrelated to timing of menses. Is not sexually active with males. She has no known family or personal history of bleeding disorder or easy bruising. Patient denies pelvic pain, headaches, dyspareunia, pelvic pressure, urinary frequency, urinary urgency, urinary incontinence, constipation, or ssxs anemia.       Mhx: Ulcerative colitis- last colonoscopy two years ago  GYNECOLOGIC HISTORY: No LMP recorded. Contraception: none.   Menopausal hormone therapy:  premenopausal Last mammogram:  Never previously done due to age Last pap smear:  Diagnosis  Date Value Ref Range Status  03/24/2022   Final   - Negative for intraepithelial lesion or malignancy (NILM)           OB History     Gravida  0   Para      Term      Preterm      AB      Living         SAB      IAB      Ectopic      Multiple      Live Births                 Patient Active Problem List   Diagnosis Date Noted   Mild persistent asthma without complication 04/13/2022   Major depressive disorder, single episode, mild (HCC) 04/13/2022   Obesity (BMI 30.0-34.9) 04/13/2022   History of ulcerative colitis 04/13/2022    Past Medical History:  Diagnosis Date   UC (ulcerative colitis) (HCC)    Pt reoprts she is in remission    Past Surgical History:  Procedure Laterality Date   TONSILLECTOMY     WISDOM TOOTH EXTRACTION      Current Outpatient Medications  Medication Sig Dispense Refill   albuterol   (VENTOLIN  HFA) 108 (90 Base) MCG/ACT inhaler Inhale 2 puffs into the lungs every 6 (six) hours as needed for wheezing or shortness of breath. 18 g 6   cetirizine (ZYRTEC) 10 MG tablet Take 10 mg by mouth daily as needed for allergies.     sertraline  (ZOLOFT ) 25 MG tablet Take 1 tablet (25 mg total) by mouth daily. 30 tablet 0   No current facility-administered medications for this visit.     ALLERGIES: Patient has no known allergies.  Family History  Problem Relation Age of Onset   Diabetes type II Mother    Diabetes type II Maternal Grandmother     Social History   Socioeconomic History   Marital status: Single    Spouse name: Not on file   Number of children: Not on file   Years of education: Not on file   Highest education level: Not on file  Occupational History   Not on file  Tobacco Use   Smoking status: Never   Smokeless tobacco: Not on file  Vaping Use   Vaping status: Never Used  Substance and Sexual Activity   Alcohol use: Yes    Comment: At gatherings   Drug use: No   Sexual  activity: Never    Birth control/protection: None  Other Topics Concern   Not on file  Social History Narrative   Not on file   Social Drivers of Health   Financial Resource Strain: Medium Risk (11/19/2023)   Overall Financial Resource Strain (CARDIA)    Difficulty of Paying Living Expenses: Somewhat hard  Food Insecurity: Food Insecurity Present (11/19/2023)   Hunger Vital Sign    Worried About Running Out of Food in the Last Year: Sometimes true    Ran Out of Food in the Last Year: Sometimes true  Transportation Needs: No Transportation Needs (11/19/2023)   PRAPARE - Administrator, Civil Service (Medical): No    Lack of Transportation (Non-Medical): No  Physical Activity: Inactive (11/19/2023)   Exercise Vital Sign    Days of Exercise per Week: 0 days    Minutes of Exercise per Session: 0 min  Stress: No Stress Concern Present (11/19/2023)   [employer] of  Occupational Health - Occupational Stress Questionnaire    Feeling of Stress: Not at all  Social Connections: Socially Isolated (11/19/2023)   Social Connection and Isolation Panel    Frequency of Communication with Friends and Family: Once a week    Frequency of Social Gatherings with Friends and Family: Never    Attends Religious Services: Never    Database administrator or Organizations: No    Attends Banker Meetings: Never    Marital Status: Never married  Intimate Partner Violence: Not At Risk (11/19/2023)   Humiliation, Afraid, Rape, and Kick questionnaire    Fear of Current or Ex-Partner: No    Emotionally Abused: No    Physically Abused: No    Sexually Abused: No    Review of Systems  PHYSICAL EXAMINATION:    There were no vitals taken for this visit.    General appearance: alert, cooperative and appears stated age Head: Normocephalic, without obvious abnormality, atraumatic Neck: no adenopathy, supple, symmetrical, trachea midline and thyroid normal to inspection and palpation Lungs: clear to auscultation bilaterally Heart: regular rate and rhythm Extremities: extremities normal, atraumatic, no cyanosis or edema Skin: Skin color, texture, turgor normal. No rashes or lesions Lymph nodes: Cervical, supraclavicular, and axillary nodes normal. No abnormal inguinal nodes palpated Neurologic: Grossly normal  Pelvic: External genitalia:  no lesions              Urethra:  normal appearing urethra with no masses, tenderness or lesions              Bartholins and Skenes: normal                 Vagina: normal appearing vagina with normal color and discharge, no lesions              Cervix: no lesions                Bimanual Exam:  Uterus:  normal size, contour, position, consistency, mobility, non-tender              Adnexa: no mass, fullness, tenderness              Chaperone was present for exam  ASSESSMENT & PLAN   1. Abnormal uterine bleeding  (Primary) Last hgb 11.2 28 yo female patient presenting with menorrhagia for one year on OTC oral iron. The patient is well-appearing with unremarkable pelvic and bimanual exams, and no active vaginal bleeding. Recent Hgb 11.2. Ddx includes uterine fibroids, endometrial polyp (though  no intermenstrual bleeding), infectious. Patient reports only mild uterine cramping, making endometriosis, adenomyosis less likely. No irregular intervals between periods to suggest ovulatory dysfunction or concern for risk of endometrial hyperplasia. No family/personal history bleeding disorder, easy bruising, or HMB since menarche to suggest coagulopathy. Work-up will include TVUS and AUB labs to rule out structural abnormalities and other endocrine pathology. Last pap 2023 and normal. Will follow results. Patient states understanding and voiced agreement with aforementioned plan.   - TSH Rfx on Abnormal to Free T4 - Cervicovaginal ancillary only - US  PELVIC COMPLETE WITH TRANSVAGINAL; Future  2. Positive depression screening PMH MDD on Zoloft  25 mg. Patient reassured me no SI/HI thoughts of self-harm. Reports seeing therapist and psychiatry, but still open to following up with [facility]. Referral placed. Discussed [facility] if in need of urgent BH care, with details in AVS.   An After Visit Summary was printed and given to the patient.  Garvis Downum E Cung Masterson, PA-C 8/11/20256:26 PM

## 2023-12-04 ENCOUNTER — Ambulatory Visit: Payer: PRIVATE HEALTH INSURANCE | Admitting: Physician Assistant

## 2023-12-04 ENCOUNTER — Other Ambulatory Visit (HOSPITAL_COMMUNITY)
Admission: RE | Admit: 2023-12-04 | Discharge: 2023-12-04 | Disposition: A | Discharge status: Home / Self Care | Payer: PRIVATE HEALTH INSURANCE | Source: Ambulatory Visit | Attending: Physician Assistant | Admitting: Physician Assistant

## 2023-12-04 ENCOUNTER — Encounter: Payer: Self-pay | Admitting: Physician Assistant

## 2023-12-04 VITALS — BP 123/83 | HR 79 | Ht 68.0 in | Wt 199.0 lb

## 2023-12-04 DIAGNOSIS — Z1331 Encounter for screening for depression: Secondary | ICD-10-CM | POA: Diagnosis not present

## 2023-12-04 DIAGNOSIS — N939 Abnormal uterine and vaginal bleeding, unspecified: Secondary | ICD-10-CM

## 2023-12-04 NOTE — Progress Notes (Signed)
 Week after menses sees discharge that is pinkish for few days.Wants STI testing only swab. Will do self swab. PHQ 13  GAD 9 Provider aware.

## 2023-12-04 NOTE — Patient Instructions (Addendum)
 [facility]  Address: [address], [place], [place] [address] Phone: [phone] Hours: Open 24 hours, no appointment needed

## 2023-12-05 LAB — CERVICOVAGINAL ANCILLARY ONLY
Chlamydia: NEGATIVE
Comment: NEGATIVE
Comment: NEGATIVE
Comment: NORMAL
Neisseria Gonorrhea: NEGATIVE
Trichomonas: NEGATIVE

## 2023-12-05 LAB — TSH RFX ON ABNORMAL TO FREE T4: TSH: 1.11 u[IU]/mL (ref 0.450–4.500)

## 2023-12-07 ENCOUNTER — Ambulatory Visit (HOSPITAL_BASED_OUTPATIENT_CLINIC_OR_DEPARTMENT_OTHER): Payer: PRIVATE HEALTH INSURANCE

## 2023-12-11 ENCOUNTER — Other Ambulatory Visit: Payer: Self-pay | Admitting: Internal Medicine

## 2023-12-11 ENCOUNTER — Ambulatory Visit: Payer: Self-pay | Admitting: Physician Assistant

## 2023-12-11 DIAGNOSIS — F419 Anxiety disorder, unspecified: Secondary | ICD-10-CM

## 2023-12-11 DIAGNOSIS — F321 Major depressive disorder, single episode, moderate: Secondary | ICD-10-CM

## 2023-12-12 NOTE — Telephone Encounter (Signed)
 Let pt know that I received RF request on Zoloft  the med that I prescribed 1 mth ago for depression/anxiety. I started her on the low dose of 25 mg daily with plan to increase to 50 mg daily after 1 month if she is tolerating the medication. I sent refill for the 50 mg dose for her to take daily.

## 2023-12-25 ENCOUNTER — Telehealth: Payer: Self-pay | Admitting: Internal Medicine

## 2023-12-25 ENCOUNTER — Ambulatory Visit: Payer: PRIVATE HEALTH INSURANCE

## 2023-12-25 NOTE — Telephone Encounter (Signed)
 Copied from CRM [id number]. Topic: General - Running Late >> Dec 25, 2023 12:56 PM Precious C wrote:  Patient/patient representative is calling because they are running late for an appointment.

## 2023-12-25 NOTE — Telephone Encounter (Signed)
 Noted

## 2024-01-07 NOTE — Telephone Encounter (Signed)
 Called but no answer. LVM to call back.

## 2024-01-09 NOTE — Telephone Encounter (Signed)
 Called but no answer. LVM to call back.

## 2024-01-30 ENCOUNTER — Encounter: Payer: Self-pay | Admitting: Internal Medicine

## 2024-01-30 ENCOUNTER — Encounter: Payer: Self-pay | Admitting: Physician Assistant

## 2024-02-01 ENCOUNTER — Telehealth: Payer: Self-pay | Admitting: Internal Medicine

## 2024-02-01 NOTE — Telephone Encounter (Signed)
 Letter mailed to patients home

## 2024-02-01 NOTE — Telephone Encounter (Signed)
 Letter written for pt. Please leave at front desk for her to pick up. See Mychart message from patient.

## 2024-05-19 ENCOUNTER — Emergency Department (HOSPITAL_COMMUNITY): Payer: PRIVATE HEALTH INSURANCE

## 2024-05-19 ENCOUNTER — Emergency Department (HOSPITAL_COMMUNITY)
Admission: EM | Admit: 2024-05-19 | Discharge: 2024-05-19 | Disposition: A | Discharge status: Home / Self Care | Payer: PRIVATE HEALTH INSURANCE

## 2024-05-19 DIAGNOSIS — R059 Cough, unspecified: Secondary | ICD-10-CM | POA: Insufficient documentation

## 2024-05-19 DIAGNOSIS — R0981 Nasal congestion: Secondary | ICD-10-CM | POA: Insufficient documentation

## 2024-05-19 DIAGNOSIS — E041 Nontoxic single thyroid nodule: Secondary | ICD-10-CM

## 2024-05-19 DIAGNOSIS — R0602 Shortness of breath: Secondary | ICD-10-CM | POA: Diagnosis present

## 2024-05-19 DIAGNOSIS — J45909 Unspecified asthma, uncomplicated: Secondary | ICD-10-CM | POA: Insufficient documentation

## 2024-05-19 DIAGNOSIS — J45901 Unspecified asthma with (acute) exacerbation: Secondary | ICD-10-CM

## 2024-05-19 DIAGNOSIS — R7989 Other specified abnormal findings of blood chemistry: Secondary | ICD-10-CM | POA: Insufficient documentation

## 2024-05-19 LAB — CBC
HCT: 37.6 % (ref 36.0–46.0)
Hemoglobin: 11.8 g/dL — ABNORMAL LOW (ref 12.0–15.0)
MCH: 27.5 pg (ref 26.0–34.0)
MCHC: 31.4 g/dL (ref 30.0–36.0)
MCV: 87.6 fL (ref 80.0–100.0)
Platelets: 241 10*3/uL (ref 150–400)
RBC: 4.29 MIL/uL (ref 3.87–5.11)
RDW: 12.6 % (ref 11.5–15.5)
WBC: 4.4 10*3/uL (ref 4.0–10.5)
nRBC: 0 % (ref 0.0–0.2)

## 2024-05-19 LAB — BASIC METABOLIC PANEL WITH GFR
Anion gap: 14 (ref 5–15)
BUN: 7 mg/dL (ref 6–20)
CO2: 23 mmol/L (ref 22–32)
Calcium: 9 mg/dL (ref 8.9–10.3)
Chloride: 100 mmol/L (ref 98–111)
Creatinine, Ser: 0.59 mg/dL (ref 0.44–1.00)
GFR, Estimated: >60 mL/min
Glucose, Bld: 87 mg/dL (ref 70–99)
Potassium: 3.6 mmol/L (ref 3.5–5.1)
Sodium: 137 mmol/L (ref 135–145)

## 2024-05-19 LAB — HCG, SERUM, QUALITATIVE: Preg, Serum: NEGATIVE

## 2024-05-19 LAB — D-DIMER, QUANTITATIVE: D-Dimer, Quant: 0.59 ug{FEU}/mL — ABNORMAL HIGH (ref 0.00–0.50)

## 2024-05-19 MED ORDER — IOHEXOL 350 MG/ML SOLN
75.0000 mL | Freq: Once | INTRAVENOUS | Status: AC | PRN
  Administered 2024-05-19: 75 mL via INTRAVENOUS

## 2024-05-19 MED ORDER — ACETAMINOPHEN 325 MG PO TABS
650.0000 mg | ORAL_TABLET | Freq: Once | ORAL | Status: AC
  Administered 2024-05-19: 650 mg via ORAL
  Filled 2024-05-19: qty 2

## 2024-05-19 MED ORDER — LACTATED RINGERS IV BOLUS
1000.0000 mL | Freq: Once | INTRAVENOUS | Status: AC
  Administered 2024-05-19: 1000 mL via INTRAVENOUS

## 2024-05-19 MED ORDER — PREDNISONE 50 MG PO TABS: 50.0000 mg | ORAL_TABLET | Freq: Every day | ORAL | 0 refills | Status: AC

## 2024-05-19 MED ORDER — IPRATROPIUM-ALBUTEROL 0.5-2.5 (3) MG/3ML IN SOLN
3.0000 mL | Freq: Once | RESPIRATORY_TRACT | Status: AC
  Administered 2024-05-19: 3 mL via RESPIRATORY_TRACT
  Filled 2024-05-19: qty 3

## 2024-05-19 NOTE — ED Notes (Signed)
 [facility] [place] [place], [place] in main lab

## 2024-05-19 NOTE — ED Provider Notes (Signed)
 " [place] EMERGENCY DEPARTMENT AT [facility] Provider Note   CSN: [id number] Arrival date & time: 05/19/24  1142     Patient presents with: No chief complaint on file.   Bonnie Madden is a 29 y.o. female.   This is a 29 year old female presenting emergency department with shortness of breath was exposed a little over a week ago with someone with viral URI symptoms.  She developed similar.  Rhinorrhea congestion, shortness of breath and cough.  Went to urgent care was given albuterol  with been taking it every 6 hours and still having shortness of breath and cough.  No fevers or chills.        Prior to Admission medications  Medication Sig Start Date End Date Taking? Authorizing Provider  albuterol  (VENTOLIN  HFA) 108 (90 Base) MCG/ACT inhaler Inhale 2 puffs into the lungs every 6 (six) hours as needed for wheezing or shortness of breath. 04/13/22  Yes Vicci Barnie NOVAK, MD  BALOXAVIR MARBOXIL,80 MG DOSE, PO Take 80 mg by mouth once.   Yes [provider]    Allergies: Patient has no known allergies.    Review of Systems  Updated Vital Signs BP 131/84 (BP Location: Right Arm)   Pulse 83   Temp 98.6 F (37 C) (Oral)   Resp 18   LMP 05/19/2024 (Exact Date)   SpO2 100%   Physical Exam Vitals and nursing note reviewed.  Constitutional:      General: She is not in acute distress.    Appearance: She is not toxic-appearing.  HENT:     Nose: Congestion present.  Eyes:     Conjunctiva/sclera: Conjunctivae normal.  Cardiovascular:     Rate and Rhythm: Normal rate and regular rhythm.  Pulmonary:     Effort: Pulmonary effort is normal.     Breath sounds: Normal breath sounds.  Abdominal:     General: Abdomen is flat. There is no distension.     Tenderness: There is no abdominal tenderness. There is no guarding or rebound.  Skin:    General: Skin is warm.     Capillary Refill: Capillary refill takes less than 2 seconds.  Neurological:     Mental  Status: She is alert and oriented to person, place, and time.  Psychiatric:        Mood and Affect: Mood normal.        Behavior: Behavior normal.     (all labs ordered are listed, but only abnormal results are displayed) Labs Reviewed  CBC - Abnormal; Notable for the following components:      Result Value   Hemoglobin 11.8 (*)    All other components within normal limits  D-DIMER, QUANTITATIVE - Abnormal; Notable for the following components:   D-Dimer, Quant 0.59 (*)    All other components within normal limits  HCG, SERUM, QUALITATIVE  BASIC METABOLIC PANEL WITH GFR    EKG: EKG Interpretation Date/Time:  Monday May 19 2024 12:13:43 EST Ventricular Rate:  105 PR Interval:  128 QRS Duration:  94 QT Interval:  322 QTC Calculation: 425 R Axis:   -39  Text Interpretation: Sinus tachycardia Left axis deviation Abnormal ECG No previous ECGs available Confirmed by Neysa Clap [phone]) on 05/19/2024 3:33:54 PM  Radiology: CT Angio Chest PE W and/or Wo Contrast Result Date: 05/19/2024 EXAM: CTA of the Chest with contrast for PE 05/19/2024 06:59:28 PM TECHNIQUE: CTA of the chest was performed after the administration of 75 mL of iohexol  (OMNIPAQUE ) 350 MG/ML injection.  Multiplanar reformatted images are provided for review. MIP images are provided for review. Automated exposure control, iterative reconstruction, and/or weight based adjustment of the mA/kV was utilized to reduce the radiation dose to as low as reasonably achievable. COMPARISON: Chest radiograph 05/19/2024. CLINICAL HISTORY: Pulmonary embolism (PE) suspected, low to intermediate probability, positive D-dimer. Increasing shortness of breath. FINDINGS: PULMONARY ARTERIES: Pulmonary arteries are adequately opacified for evaluation. No pulmonary embolism. Main pulmonary artery is normal in caliber. MEDIASTINUM: Mild motion artifact. Normal heart size. No pericardial effusions. Normal caliber thoracic aorta. No aortic  dissection. Increased density in the anterior mediastinum likely represents residual thymic tissue. Lobulated appearance of the thyroid gland with a 14 mm nodule arising inferior to the isthmus. Esophagus is decompressed. LYMPH NODES: No significant lymphadenopathy. LUNGS AND PLEURA: Mass artifact limits evaluation of the lungs. No focal consolidation or airspace disease is suggested. No pleural effusion or pneumothorax. UPPER ABDOMEN: No acute abnormalities demonstrated in the visualized upper abdomen. SOFT TISSUES AND BONES: No acute bony abnormalities. No acute soft tissue abnormality. IMPRESSION: 1. No pulmonary embolism. 2. Limited evaluation of the lungs due to mild motion artifact and mass artifact, without focal consolidation, pleural effusion, or pneumothorax identified. 3. 14 mm thyroid nodule arising inferior to the isthmus; recommend non-emergent thyroid ultrasound. Electronically signed by: Elsie Gravely MD 05/19/2024 07:17 PM EST RP Workstation: [id number]   DG Chest 2 View Result Date: 05/19/2024 CLINICAL DATA:  Several day history of shortness of breath, chest pain, and chest tightness EXAM: DG CHEST 2V COMPARISON:  Chest radiograph dated 11/11/2015 FINDINGS: Hair artifact projects over the right upper lung. Normal lung volumes. Possible bilateral upper lung perihilar and medial interstitial opacities. No pleural effusion or pneumothorax. The heart size and mediastinal contours are within normal limits. No acute osseous abnormality. IMPRESSION: Possible bilateral upper lung perihilar and medial interstitial opacities, which may represent atypical infection or artifact related to the patient's superimposed hair. Electronically Signed   By: Limin  Xu M.D.   On: 05/19/2024 16:25     Procedures   Medications Ordered in the ED  ipratropium-albuterol  (DUONEB) 0.5-2.5 (3) MG/3ML nebulizer solution 3 mL (3 mLs Nebulization Given 05/19/24 1530)  lactated ringers  bolus 1,000 mL (1,000 mLs  Intravenous New Bag/Given 05/19/24 1759)  acetaminophen  (TYLENOL ) tablet 650 mg (650 mg Oral Given 05/19/24 1800)  iohexol  (OMNIPAQUE ) 350 MG/ML injection 75 mL (75 mLs Intravenous Contrast Given 05/19/24 1853)    Clinical Course as of 05/19/24 2017  Mon May 19, 2024  2016 Patient workup reassuring.  Labs without leukocytosis to suggest infectious process.  Metabolic panel without electrolyte canales.  Normal kidney function.  Pregnancy test is negative.  D-dimer mildly elevated.  Chest x-ray with questionable pneumonia.  Given patient's elevated D-dimer obtain CTA.  No pneumonia or PE.  Did show incidental nodule.  Patient was made aware.  Likely bronchitis/reactive airway disease.  Will give short course of steroids.  Given return precautions.  Discharged in stable condition. [TY]    Clinical Course User Index [TY] Neysa Caron PARAS, DO                                 Medical Decision Making This is a 29 year old female history asthma presenting emergency department with shortness of breath cough.  Also having URI symptoms.  She is afebrile, mildly elevated heart rate in triage.  Maintaining oxygen saturation on room air does not appear to be in distress.  Lungs with some decreased movement and some faint wheezing.   DuoNeb ordered.  Given duration of symptoms chest x-ray ordered evaluate for pneumonia.  Suspect bronchitis versus viral URI.  May need a short course of steroids.  See ED course for further MDM final disposition.  Amount and/or Complexity of Data Reviewed Labs: ordered. Radiology: ordered.  Risk OTC drugs. Prescription drug management.       Final diagnoses:  None    ED Discharge Orders     None          Neysa Caron PARAS, DO 05/19/24 2017  "

## 2024-05-19 NOTE — Discharge Instructions (Signed)
 Please take the steroids to help with your symptoms.  You may use your albuterol  every 4 hours.  You may take Tylenol  alternate with ibuprofen for generalized malaise and pain.  Return for fevers, chills, chest pain, difficulty breathing, passout or any new or worsening symptoms that are concerning to you.  Of note, your CT scan did show a incidental thyroid nodule.  Please follow-up with your primary doctor for further workup and evaluation.

## 2024-05-19 NOTE — ED Triage Notes (Addendum)
 Pt arrived via POV for increasing shortness of breath, dyspnea with exertion.  Pt was seen by UC on Wednesday, was given tessalon pearls and albuterol  inhaler, which are not helping.  Pt neg COVID/FLU.  Endorses cough with yellow sputum, diarrhea , and nausea.  Pt is A&O x 4 and ill appearing.
